# Patient Record
Sex: Female | Born: 1980 | Race: White | Hispanic: No | Marital: Married | Smoking: Former smoker
Health system: Southern US, Community
[De-identification: ages and names within clinical notes are randomized; demographics above are authoritative.]

## PROBLEM LIST (undated history)

## (undated) DIAGNOSIS — T7840XA Allergy, unspecified, initial encounter: Secondary | ICD-10-CM

## (undated) DIAGNOSIS — C801 Malignant (primary) neoplasm, unspecified: Secondary | ICD-10-CM

## (undated) DIAGNOSIS — L309 Dermatitis, unspecified: Secondary | ICD-10-CM

## (undated) DIAGNOSIS — E079 Disorder of thyroid, unspecified: Secondary | ICD-10-CM

## (undated) DIAGNOSIS — E669 Obesity, unspecified: Secondary | ICD-10-CM

## (undated) DIAGNOSIS — D649 Anemia, unspecified: Secondary | ICD-10-CM

## (undated) DIAGNOSIS — J45909 Unspecified asthma, uncomplicated: Secondary | ICD-10-CM

## (undated) DIAGNOSIS — E039 Hypothyroidism, unspecified: Secondary | ICD-10-CM

## (undated) HISTORY — PX: WISDOM TOOTH EXTRACTION: SHX21

## (undated) HISTORY — DX: Allergy, unspecified, initial encounter: T78.40XA

## (undated) HISTORY — PX: TUBAL LIGATION: SHX77

## (undated) HISTORY — PX: TONSILLECTOMY: SUR1361

## (undated) HISTORY — DX: Obesity, unspecified: E66.9

## (undated) HISTORY — DX: Unspecified asthma, uncomplicated: J45.909

---

## 2008-04-16 ENCOUNTER — Inpatient Hospital Stay (HOSPITAL_COMMUNITY): Admission: AD | Admit: 2008-04-16 | Discharge: 2008-04-20 | Payer: Self-pay | Admitting: Obstetrics and Gynecology

## 2010-11-09 ENCOUNTER — Inpatient Hospital Stay (HOSPITAL_COMMUNITY): Admission: AD | Admit: 2010-11-09 | Payer: Self-pay | Admitting: Obstetrics and Gynecology

## 2011-02-23 ENCOUNTER — Inpatient Hospital Stay (HOSPITAL_COMMUNITY)
Admission: AD | Admit: 2011-02-23 | Discharge: 2011-02-24 | Disposition: A | Discharge status: Home / Self Care | Payer: BC Managed Care – PPO | Source: Ambulatory Visit | Attending: Obstetrics & Gynecology | Admitting: Obstetrics & Gynecology

## 2011-02-23 DIAGNOSIS — O4700 False labor before 37 completed weeks of gestation, unspecified trimester: Secondary | ICD-10-CM | POA: Insufficient documentation

## 2011-02-23 LAB — URINALYSIS, ROUTINE W REFLEX MICROSCOPIC
Bilirubin Urine: NEGATIVE
Glucose, UA: NEGATIVE mg/dL
Hgb urine dipstick: NEGATIVE
Specific Gravity, Urine: <1.005 — ABNORMAL LOW (ref 1.005–1.030)
Urobilinogen, UA: 0.2 mg/dL (ref 0.0–1.0)

## 2011-03-25 NOTE — H&P (Signed)
Mallory Russell, Mallory Russell                  ACCOUNT NO.:  [id number]   MEDICAL RECORD NO.:  [id number]          PATIENT TYPE:  INP   LOCATION:  9167                          FACILITY:  [facility]   PHYSICIAN:  Lenoard Aden, M.D.DATE OF BIRTH:  09/19/1981   DATE OF ADMISSION:  04/16/2008  DATE OF DISCHARGE:                              HISTORY & PHYSICAL   CHIEF COMPLAINT:  LGA with polyhydramnios for induction.   HISTORY OF PRESENT ILLNESS:  She is a 30 year old white female, gravida  1, para 0, at 39+ weeks gestation with favorable cervix and estimated  fetal weight greater than 90th percentile for controlled effort  induction and delivery.  She is a nonsmoker and nondrinker.  She denies  domestic or physical violence.   MEDICATIONS:  Prenatal vitamins and Synthroid.   ALLERGIES:  No known drug allergies.  No known latex allergy.   FAMILY HISTORY:  Diabetes, heart disease, myocardial infarction, chronic  hypertension, breast cancer, thyroid disease, and rheumatoid arthritis.   PAST SURGICAL HISTORY:  Noncontributory.   PHYSICAL EXAMINATION:  GENERAL:  She is a well-developed, well-  nourished, white female in no acute distress.  HEENT:  Normal.  LUNGS:  Clear.  HEART:  Regular rhythm.  ABDOMEN:  Soft, gravid, and nontender.  Estimated fetal weight  approximately 9 pounds.  PELVIC:  Cervix is closed, 70%, vertex -1.  EXTREMITIES:  No cords.  NEUROLOGY:  Nonfocal.  SKIN:  Intact.   IMPRESSION:  39+ week intrauterine pregnancy with large for gestational  age, and new onset gestational hypertension.   PLAN:  Proceed with cervical ripening and induction with Cervidil  placement, NST reactive.      Lenoard Aden, M.D.  Electronically Signed     RJT/MEDQ  D:  04/16/2008  T:  04/16/2008  Job:  [id number]

## 2011-03-25 NOTE — Op Note (Signed)
[name], Mallory Russell                  ACCOUNT NO.:  [id number]   MEDICAL RECORD NO.:  [id number]          PATIENT TYPE:  INP   LOCATION:  9113                          FACILITY:  [facility]   PHYSICIAN:  Lenoard Aden, M.D.DATE OF BIRTH:  20-Apr-1981   DATE OF PROCEDURE:  DATE OF DISCHARGE:                               OPERATIVE REPORT   PREOPERATIVE DIAGNOSIS:  Active phase arrest at 39 weeks.   POSTOPERATIVE DIAGNOSIS:  Active phase arrest at 39 weeks.   PROCEDURE:  Primary low segment transverse cesarean section.   SURGEON:  Lenoard Aden, MD   ASSISTANT:  Marlinda Mike, C.N.M.   ANESTHESIA:  Epidural by Jean Rosenthal.   ESTIMATED BLOOD LOSS:  1000 mL.   COMPLICATIONS:  None.   DRAINS:  Foley.   COUNTS:  Correct.   The patient was taken to recovery in good condition   BRIEF OPERATIVE NOTE:  After being apprised of risks of anesthesia,  infection, bleeding, injury to abdominal organs, need for repair.  The  labor's immediate complications to include bowel and bladder injury.  The patient was brought to the operating room where she was administered  a dosing of epidural anesthetic without complications, prepped and  draped in usual sterile fashion.  Foley catheter placed after achieving  adequate anesthesia.  Dilute Marcaine solution placed.  Transverse skin  incision made with a scalpel, carried down to fascia, which nicked in  the midline and transversely using Mayo scissors.  Rectus muscle was  dissected sharply in the midline.  Peritoneum was entered sharply and  blunt, bladder blade placed.  Visceral peritoneum scored sharply off the  lower uterine segment.  [name] hysterotomy incision was made, atraumatic  delivery of 8 pound 13 ounces female, handed to pediatricians team,  Apgars 8 and 9, cord blood collected.  Placenta delivered manually,  intact 3-vessel cord.  Uterus, curetted using a dry-lap pack and closed  in 2 running imbricating layers of 0 Monocryl suture.  Good  hemostasis  was noted.  Bladder flap inspected and found be hemostatic.  Irrigation  accomplished.  Normal tubes and normal ovaries noted.  Fascia closed using a 0-Monocryl in continuous running fashion.  Subcutaneous tissue reapproximated using a 2-0 plain suture.  Skin  closed using staples.  The patient tolerated the procedure well and was  transferred to recovery in good condition.      Lenoard Aden, M.D.  Electronically Signed     RJT/MEDQ  D:  04/17/2008  T:  04/18/2008  Job:  [id number]

## 2011-03-27 ENCOUNTER — Encounter (HOSPITAL_COMMUNITY): Payer: BC Managed Care – PPO

## 2011-03-27 ENCOUNTER — Other Ambulatory Visit: Payer: Self-pay | Admitting: Obstetrics and Gynecology

## 2011-03-27 LAB — CBC
HCT: 37.5 % (ref 36.0–46.0)
Hemoglobin: 12.1 g/dL (ref 12.0–15.0)
MCH: 27.7 pg (ref 26.0–34.0)
MCV: 85.8 fL (ref 78.0–100.0)
Platelets: 173 10*3/uL (ref 150–400)
RBC: 4.37 MIL/uL (ref 3.87–5.11)
WBC: 14 10*3/uL — ABNORMAL HIGH (ref 4.0–10.5)

## 2011-03-27 LAB — RPR: RPR Ser Ql: NONREACTIVE

## 2011-03-28 NOTE — Discharge Summary (Signed)
NAME:  ADDY, MCMANNIS                ACCOUNT NO.:  [id number]   MEDICAL RECORD NO.:  [id number]           PATIENT TYPE:   LOCATION:                                FACILITY:  [facility]   PHYSICIAN:  Lenoard Aden, M.D.     DATE OF BIRTH:   DATE OF ADMISSION:  04/16/2008  DATE OF DISCHARGE:  04/20/2008                               DISCHARGE SUMMARY   REASON FOR ADMISSION:  Large for gestational age fetus and  polyhydramnios at term.   DISCHARGE DIAGNOSES:  Large for gestational age fetus and polyhydramnios  at term.   PERTINENT FINDINGS:  Normal physical exam.   LABORATORY DATA:  Normal CBC.   HOSPITAL COURSE:  The patient underwent uncomplicated primary C-section  for active phase arrest.   Postoperative course uncomplicated.  Discharged to home on postoperative  day #3.   FINAL DIAGNOSES:  Large for gestational age fetus and polyhydramnios at  term.   CONDITION ON DISCHARGE:  Good.   COMPLICATIONS:  None.   DISCHARGE MEDICATIONS:  Prenatal vitamins and Tylox.   FOLLOWUP:  Follow up in the office in 4-6 weeks.  Discharge teaching  done.      Lenoard Aden, M.D.  Electronically Signed     RJT/MEDQ  D:  06/15/2008  T:  06/15/2008  Job:  [id number]

## 2011-03-28 NOTE — Discharge Summary (Signed)
Mallory Russell, Mallory Russell                  ACCOUNT NO.:  [id number]   MEDICAL RECORD NO.:  [id number]          PATIENT TYPE:   LOCATION:                                 FACILITY:   PHYSICIAN:  Lenoard Aden, M.D.     DATE OF BIRTH:   DATE OF ADMISSION:  04/16/2008  DATE OF DISCHARGE:  04/20/2008                               DISCHARGE SUMMARY   The patient underwent uncomplicated C-section, April 16, 2008.   Discharge to home on day #3.  Discharge teaching done.  Tolerated diet  well.   Prenatal vitamins and iron given.      Lenoard Aden, M.D.  Electronically Signed     RJT/MEDQ  D:  05/20/2008  T:  05/21/2008  Job:  [id number]

## 2011-04-02 ENCOUNTER — Inpatient Hospital Stay (HOSPITAL_COMMUNITY)
Admission: RE | Admit: 2011-04-02 | Discharge: 2011-04-05 | DRG: 370 | Disposition: A | Discharge status: Home / Self Care | Payer: BC Managed Care – PPO | Source: Ambulatory Visit | Attending: Obstetrics and Gynecology | Admitting: Obstetrics and Gynecology

## 2011-04-02 DIAGNOSIS — O34219 Maternal care for unspecified type scar from previous cesarean delivery: Principal | ICD-10-CM | POA: Diagnosis present

## 2011-04-02 DIAGNOSIS — D62 Acute posthemorrhagic anemia: Secondary | ICD-10-CM | POA: Diagnosis not present

## 2011-04-02 DIAGNOSIS — E669 Obesity, unspecified: Secondary | ICD-10-CM | POA: Diagnosis present

## 2011-04-02 DIAGNOSIS — Z01818 Encounter for other preprocedural examination: Secondary | ICD-10-CM

## 2011-04-02 DIAGNOSIS — Z01812 Encounter for preprocedural laboratory examination: Secondary | ICD-10-CM

## 2011-04-02 DIAGNOSIS — O9903 Anemia complicating the puerperium: Secondary | ICD-10-CM | POA: Diagnosis not present

## 2011-04-02 DIAGNOSIS — E039 Hypothyroidism, unspecified: Secondary | ICD-10-CM | POA: Diagnosis present

## 2011-04-02 DIAGNOSIS — E079 Disorder of thyroid, unspecified: Secondary | ICD-10-CM | POA: Diagnosis present

## 2011-04-03 LAB — CBC
HCT: 29.2 % — ABNORMAL LOW (ref 36.0–46.0)
Hemoglobin: 9.3 g/dL — ABNORMAL LOW (ref 12.0–15.0)
MCV: 86.4 fL (ref 78.0–100.0)
RBC: 3.38 MIL/uL — ABNORMAL LOW (ref 3.87–5.11)
RDW: 15.6 % — ABNORMAL HIGH (ref 11.5–15.5)
WBC: 10.5 10*3/uL (ref 4.0–10.5)

## 2011-04-11 NOTE — Discharge Summary (Signed)
Mallory Russell, Mallory Russell                  ACCOUNT NO.:  [id number]  MEDICAL RECORD NO.:  [id number]           PATIENT TYPE:  I  LOCATION:  9106                          FACILITY:  [facility]  PHYSICIAN:  Lenoard Aden, M.D.DATE OF BIRTH:  1981/01/17  DATE OF ADMISSION:  04/02/2011 DATE OF DISCHARGE:  04/05/2011                              DISCHARGE SUMMARY   ADMISSION DIAGNOSES:  Intrauterine pregnancy at term, previous cesarean section.  DISCHARGE DIAGNOSES: 1. Post operative day 3. 2. Acute blood loss anemia stable and dependent edema stable.  HISTORY:  Gravida 4, para 1-0-2-1 at 25 weeks' gestation with [facility] of Apr 09, 2011.  Prenatal care was obtained at [facility] since 9 weeks and 5 days' gestation with Dr. Billy Coast as primary.  PRENATAL LABS:  A+, rubella immune, GBS negative, HIV negative, RPR nonreactive, hepatitis B nonreactive, and 1-hour GTT 130.  Prenatal course was complicated by hypothyroid and pubic diathesis. MEDICAL SURGICAL HISTORY:  The patient has a known history of PCOS, hypothyroid, obesity, previous cesarean section, LEEP, and tonsillectomy.  ALLERGIES:  The patient has no known drug allergies.  CURRENT MEDICATIONS:  Synthroid prenatal vitamins and Tylenol.  The patient presented for scheduled cesarean section.  Admission labs are WBCs 14.0, hemoglobin 12.1, hematocrit 37.5, and platelets 173, RPR negative and MRSA by PCR negative.  SURGERY:  The patient was delivered via repeat cesarean section on Apr 02, 2011, by Dr. Billy Coast for indication previous cesarean section.  The patient was delivered of a female infant weight of 9 pounds 2 ounces and Apgars 9 and 9.  Newborn was transferred to regular nursery. Please see  operative note for further details.  POSTOPERATIVE COURSE:  Postoperative course was complicated by acute blood loss anemia and dependent edema.  She was started on oral iron and received hydrochlorothiazide 25 mg on postop day #3 to be continued times 4-6  days.  LABORATORY DATA:  WBC 10.5, hemoglobin 9.3, hematocrit 29.2, and platelets 126.  Vital signs remained stable, and the patient was afebrile throughout her hospital course.  Physical exam was within normal limits with exception of +2 to +3 pedal edema.  Wound was well approximated with staples.  No erythema, no ecchymosis, and no drainage at site.  Newborn was breastfed and underwent circumcision during hospital stay.  The patient was discharged home on postoperative day #3 in stable condition.  Staples were removed prior to discharge and replaced with Steri-Strips.  Diet regular, activity ad lib, and postop weight lifting restrictions x2 weeks.  Instructions per [facility] booklet.  DISCHARGE MEDICATIONS: 1. Prenatal vitamins 1 tablet p.o. daily. 2. Colace 100 mg p.o. daily. 3. Ibuprofen 800 mg q.8 h. 4. Tylox 1-2 tablets p.o. q.4-6 h p.r.n. 5. Nu-Iron 150 one tablet p.o. daily. 6. Synthroid 175 mcg p.o. daily. 7. Hydrochlorothiazide 12.5 mg p.o. daily x4-6 days.  Follow up is in 6 weeks for postpartum visit with [facility].    ______________________________ Arlan Organ, CNM   ______________________________ Lenoard Aden, M.D.    DP/MEDQ  D:  04/05/2011  T:  04/06/2011  Job:  [id number]  Electronically Signed by Arlan Organ  CNM on 04/07/2011 12:26:24 AM Electronically Signed by Olivia Mackie M.D. on 04/11/2011 02:02:11 PM

## 2011-04-11 NOTE — Op Note (Signed)
  NAMETELLY, Mallory Russell                  ACCOUNT NO.:  [id number]  MEDICAL RECORD NO.:  [id number]           PATIENT TYPE:  I  LOCATION:  9106                          FACILITY:  [facility]  PHYSICIAN:  Lenoard Aden, M.D.DATE OF BIRTH:  11/07/1981  DATE OF PROCEDURE:  04/02/2011 DATE OF DISCHARGE:                              OPERATIVE REPORT   PREOPERATIVE DIAGNOSES: 1. A 39-week intrauterine pregnancy. 2. Previous C-section, repeat.  POSTOPERATIVE DIAGNOSES: 1. A 39-week intrauterine pregnancy. 2. Previous C-section, repeat.  PROCEDURE:  Repeat low segment transverse cesarean section.  SURGEON:  Lenoard Aden, MD.  ASSISTANT:  Marlinda Mike, CNM.  ANESTHESIA:  Spinal by Jean Rosenthal.  ESTIMATED BLOOD LOSS:  1000 mL.  COMPLICATIONS:  None.  DRAINS:  Foley.  COUNTS:  Correct.  The patient went to recovery in good condition.  FINDINGS:  Full-term living female, occiput transverse position, 9 pounds 2 ounces, Apgars 8 and 9, posterior placenta.  Normal uterus, normal tubes, normal ovaries, two-layer uterine closure.  BRIEF OPERATIVE NOTE:  After being apprised of the risks of anesthesia, infection, bleeding, injury to abdominal organs, need for repair, delayed versus immediate complications to include bowel and bladder injury, possible need for repair, the patient brought to the operating room where she administered spinal anesthetic without complications. She was prepped and draped in sterile fashion.  Foley catheter was placed.  After achieving adequate anesthesia, dilute Marcaine solution placed.  Pfannenstiel skin incision made with scalpel, carried down the fascia, which nicked in the midline, and transversely using Mayo scissors.  Rectus muscles dissected sharply in the midline, peritoneum entered sharply.  Bladder blade placed.  Visceral peritoneum scored sharply off the lower uterine segment.  Kerr hysterotomy incision made. Atraumatic delivery after amniotomy of  clear fluid, full-term living female, Apgars 8 and 9, handed to pediatricians attendance.  Cord blood collected.  Placenta delivered manually.  Intact three-vessel cord. Uterus exteriorized, curetted using dry lap pack, and closed in two running imbricating layers of zero Monocryl suture.  Single interrupted suture placed in midline for hemostasis.  Bladder flap was inspected and found to be hemostatic.  Irrigation accomplished.  Urine output is clear and urine output is placed.  At this time, the fascia was closed using zero Monocryl in continuous running fashion.  Subcutaneous tissue reapproximated using zero plain in continuous running fashion.  Skin closed using staples.  The patient tolerated the procedure well and transferred to recovery in good condition.     Lenoard Aden, M.D.     RJT/MEDQ  D:  04/02/2011  T:  04/03/2011  Job:  [id number]  Electronically Signed by Olivia Mackie M.D. on 04/11/2011 02:02:09 PM

## 2011-08-07 LAB — CBC
HCT: 32.3 — ABNORMAL LOW
HCT: 35.1 — ABNORMAL LOW
Hemoglobin: 11.9 — ABNORMAL LOW
MCHC: 33.9
MCHC: 35.3
MCV: 84.5
MCV: 85.8
Platelets: 147 — ABNORMAL LOW
RBC: 4.09
RDW: 15.3
WBC: 16.5 — ABNORMAL HIGH

## 2012-04-24 ENCOUNTER — Emergency Department (HOSPITAL_BASED_OUTPATIENT_CLINIC_OR_DEPARTMENT_OTHER): Payer: BC Managed Care – PPO

## 2012-04-24 ENCOUNTER — Emergency Department (HOSPITAL_BASED_OUTPATIENT_CLINIC_OR_DEPARTMENT_OTHER)
Admission: EM | Admit: 2012-04-24 | Discharge: 2012-04-24 | Disposition: A | Discharge status: Home / Self Care | Payer: BC Managed Care – PPO | Attending: Emergency Medicine | Admitting: Emergency Medicine

## 2012-04-24 ENCOUNTER — Encounter (HOSPITAL_BASED_OUTPATIENT_CLINIC_OR_DEPARTMENT_OTHER): Payer: Self-pay | Admitting: *Deleted

## 2012-04-24 DIAGNOSIS — E039 Hypothyroidism, unspecified: Secondary | ICD-10-CM | POA: Insufficient documentation

## 2012-04-24 DIAGNOSIS — S8000XA Contusion of unspecified knee, initial encounter: Secondary | ICD-10-CM | POA: Insufficient documentation

## 2012-04-24 DIAGNOSIS — Z3490 Encounter for supervision of normal pregnancy, unspecified, unspecified trimester: Secondary | ICD-10-CM

## 2012-04-24 DIAGNOSIS — Z3201 Encounter for pregnancy test, result positive: Secondary | ICD-10-CM | POA: Insufficient documentation

## 2012-04-24 DIAGNOSIS — M7989 Other specified soft tissue disorders: Secondary | ICD-10-CM | POA: Insufficient documentation

## 2012-04-24 DIAGNOSIS — X58XXXA Exposure to other specified factors, initial encounter: Secondary | ICD-10-CM | POA: Insufficient documentation

## 2012-04-24 DIAGNOSIS — N898 Other specified noninflammatory disorders of vagina: Secondary | ICD-10-CM | POA: Insufficient documentation

## 2012-04-24 DIAGNOSIS — S8011XA Contusion of right lower leg, initial encounter: Secondary | ICD-10-CM

## 2012-04-24 HISTORY — DX: Disorder of thyroid, unspecified: E07.9

## 2012-04-24 LAB — PREGNANCY, URINE: Preg Test, Ur: POSITIVE — AB

## 2012-04-24 NOTE — Discharge Instructions (Signed)
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 Your beta hCG was 29 and it was undeterminable whether you have a viable intrauterine pregnancy versus an ectopic pregnancy. Please return immediately to the nearest emergency department if you begin to experience abdominal cramping and pain. You may also wish to return to [facility] hospital if this occurs.    Contusion A contusion is a deep bruise. Contusions are the result of an injury that caused bleeding under the skin. The contusion may turn blue, purple, or yellow. Minor injuries will give you a painless contusion, but more severe contusions may stay painful and swollen for a few weeks.  CAUSES  A contusion is usually caused by a blow, trauma, or direct force to an area of the body. SYMPTOMS   Swelling and redness of the injured area.   Bruising of the injured area.   Tenderness and soreness of the injured area.   Pain.  DIAGNOSIS  The diagnosis can be made by taking a history and physical exam. An X-ray, CT scan, or MRI may be needed to determine if there were any associated injuries, such as fractures. TREATMENT  Specific treatment will depend on what area of the body was injured. In general, the best treatment for a contusion is resting, icing, elevating, and applying cold compresses to the injured area. Over-the-counter medicines may also be recommended for pain control. Ask your caregiver what the best treatment is for your contusion. HOME CARE INSTRUCTIONS   Put ice on the injured area.   Put ice in a plastic bag.   Place a towel between your skin and the bag.   Leave the ice on for 15 to 20 minutes, 3 to 4 times a day.   Only take over-the-counter or prescription medicines for pain, discomfort, or fever as directed by your caregiver. Your caregiver may recommend avoiding anti-inflammatory medicines (aspirin, ibuprofen, and naproxen) for 48 hours because these  medicines may increase bruising.   Rest the injured area.   If possible, elevate the injured area to reduce swelling.  SEEK IMMEDIATE MEDICAL CARE IF:   You have increased bruising or swelling.   You have pain that is getting worse.   Your swelling or pain is not relieved with medicines.  MAKE SURE YOU:   Understand these instructions.   Will watch your condition.   Will get help right away if you are not doing well or get worse.  Document Released: 08/06/2005 Document Revised: 10/16/2011 Document Reviewed: 09/01/2011 [facility] Patient Information 2012 [place], [place].Pregnancy If you are planning on getting pregnant, it is a good idea to make a preconception appointment with your care- giver to discuss having a healthy lifestyle before getting pregnant. Such as, diet, weight, exercise, taking prenatal vitamins especially folic acid (it helps prevent brain and spinal cord defects), avoiding alcohol, smoking and illegal drugs, medical problems (diabetes, convulsions), family history of genetic problems, working conditions and immunizations. It is better to have knowledge of these things and do something about them before getting pregnant. In your pregnancy, it is important to follow certain guidelines to have a healthy baby. It is very important to get good prenatal care and follow your caregiver's instructions. Prenatal care includes all the medical care you receive before your baby's birth. This helps to prevent problems during the pregnancy and childbirth. HOME CARE INSTRUCTIONS   Start your prenatal visits by the 12th week of pregnancy or before when possible. They are usually  scheduled monthly at first. They are more often in the last 2 months before delivery. It is important that you keep your caregiver's appointments and follow your caregiver's instructions regarding medication use, exercise, and diet.   During pregnancy, you are providing food for you and your baby. Eat a regular,  well-balanced diet. Choose foods such as meat, fish, milk and other dairy products, vegetables, fruits, whole-grain breads and cereals. Your caregiver will inform you of the ideal weight gain depending on your current height and weight. Drink lots of liquids. Try to drink 8 glasses of water a day.   Alcohol is associated with a number of birth defects including fetal alcohol syndrome. It is best to avoid alcohol completely. Smoking will cause low birth rate and prematurity. Use of alcohol and nicotine during your pregnancy also increases the chances that your child will be chemically dependent later in their life and may contribute to SIDS (Sudden Infant Death Syndrome).   Do not use illegal drugs.   Only take prescription or over-the-counter medications that are recommended by your caregiver. Other medications can cause genetic and physical problems in the baby.   Morning sickness can often be helped by keeping soda crackers at the bedside. Eat a couple before arising in the morning.   A sexual relationship may be continued until near the end of pregnancy if there are no other problems such as early (premature) leaking of amniotic fluid from the membranes, vaginal bleeding, painful intercourse or belly (abdominal) pain.   Exercise regularly. Check with your caregiver if you are unsure of the safety of some of your exercises.   Do not use hot tubs, steam rooms or saunas. These increase the risk of fainting or passing out and hurting yourself and the baby. Swimming is OK for exercise. Get plenty of rest, including afternoon naps when possible especially in the third trimester.   Avoid toxic odors and chemicals.   Do not wear high heels. They may cause you to lose your balance and fall.   Do not lift over 5 pounds. If you do lift anything, lift with your legs and thighs, not your back.   Avoid long trips, especially in the third trimester.   If you have to travel out of the city or state, take  a copy of your medical records with you.  SEEK IMMEDIATE MEDICAL CARE IF:   You develop an unexplained oral temperature above 102 F (38.9 C), or as your caregiver suggests.   You have leaking of fluid from the vagina. If leaking membranes are suspected, take your temperature and inform your caregiver of this when you call.   There is vaginal spotting or bleeding. Notify your caregiver of the amount and how many pads are used.   You continue to feel sick to your stomach (nauseous) and have no relief from remedies suggested, or you throw up (vomit) blood or coffee ground like materials.   You develop upper abdominal pain.   You have round ligament discomfort in the lower abdominal area. This still must be evaluated by your caregiver.   You feel contractions of the uterus.   You do not feel the baby move, or there is less movement than before.   You have painful urination.   You have abnormal vaginal discharge.   You have persistent diarrhea.   You get a severe headache.   You have problems with your vision.   You develop muscle weakness.   You feel dizzy and faint.  You develop shortness of breath.   You develop chest pain.   You have back pain that travels down to your leg and feet.   You feel irregular or a very fast heartbeat.   You develop excessive weight gain in a short period of time (5 pounds in 3 to 5 days).   You are involved with a domestic violence situation.  Document Released: 10/27/2005 Document Revised: 10/16/2011 Document Reviewed: 04/20/2009 [facility] Patient Information 2012 [place], [place].Pregnancy If you are planning on getting pregnant, it is a good idea to make a preconception appointment with your care- giver to discuss having a healthy lifestyle before getting pregnant. Such as, diet, weight, exercise, taking prenatal vitamins especially folic acid (it helps prevent brain and spinal cord defects), avoiding alcohol, smoking and illegal drugs,  medical problems (diabetes, convulsions), family history of genetic problems, working conditions and immunizations. It is better to have knowledge of these things and do something about them before getting pregnant. In your pregnancy, it is important to follow certain guidelines to have a healthy baby. It is very important to get good prenatal care and follow your caregiver's instructions. Prenatal care includes all the medical care you receive before your baby's birth. This helps to prevent problems during the pregnancy and childbirth. HOME CARE INSTRUCTIONS   Start your prenatal visits by the 12th week of pregnancy or before when possible. They are usually scheduled monthly at first. They are more often in the last 2 months before delivery. It is important that you keep your caregiver's appointments and follow your caregiver's instructions regarding medication use, exercise, and diet.   During pregnancy, you are providing food for you and your baby. Eat a regular, well-balanced diet. Choose foods such as meat, fish, milk and other dairy products, vegetables, fruits, whole-grain breads and cereals. Your caregiver will inform you of the ideal weight gain depending on your current height and weight. Drink lots of liquids. Try to drink 8 glasses of water a day.   Alcohol is associated with a number of birth defects including fetal alcohol syndrome. It is best to avoid alcohol completely. Smoking will cause low birth rate and prematurity. Use of alcohol and nicotine during your pregnancy also increases the chances that your child will be chemically dependent later in their life and may contribute to SIDS (Sudden Infant Death Syndrome).   Do not use illegal drugs.   Only take prescription or over-the-counter medications that are recommended by your caregiver. Other medications can cause genetic and physical problems in the baby.   Morning sickness can often be helped by keeping soda crackers at the  bedside. Eat a couple before arising in the morning.   A sexual relationship may be continued until near the end of pregnancy if there are no other problems such as early (premature) leaking of amniotic fluid from the membranes, vaginal bleeding, painful intercourse or belly (abdominal) pain.   Exercise regularly. Check with your caregiver if you are unsure of the safety of some of your exercises.   Do not use hot tubs, steam rooms or saunas. These increase the risk of fainting or passing out and hurting yourself and the baby. Swimming is OK for exercise. Get plenty of rest, including afternoon naps when possible especially in the third trimester.   Avoid toxic odors and chemicals.   Do not wear high heels. They may cause you to lose your balance and fall.   Do not lift over 5 pounds. If you do lift anything,  lift with your legs and thighs, not your back.   Avoid long trips, especially in the third trimester.   If you have to travel out of the city or state, take a copy of your medical records with you.  SEEK IMMEDIATE MEDICAL CARE IF:   You develop an unexplained oral temperature above 102 F (38.9 C), or as your caregiver suggests.   You have leaking of fluid from the vagina. If leaking membranes are suspected, take your temperature and inform your caregiver of this when you call.   There is vaginal spotting or bleeding. Notify your caregiver of the amount and how many pads are used.   You continue to feel sick to your stomach (nauseous) and have no relief from remedies suggested, or you throw up (vomit) blood or coffee ground like materials.   You develop upper abdominal pain.   You have round ligament discomfort in the lower abdominal area. This still must be evaluated by your caregiver.   You feel contractions of the uterus.   You do not feel the baby move, or there is less movement than before.   You have painful urination.   You have abnormal vaginal discharge.   You  have persistent diarrhea.   You get a severe headache.   You have problems with your vision.   You develop muscle weakness.   You feel dizzy and faint.   You develop shortness of breath.   You develop chest pain.   You have back pain that travels down to your leg and feet.   You feel irregular or a very fast heartbeat.   You develop excessive weight gain in a short period of time (5 pounds in 3 to 5 days).   You are involved with a domestic violence situation.  Document Released: 10/27/2005 Document Revised: 10/16/2011 Document Reviewed: 04/20/2009 [facility] Patient Information 2012 [place], [place].

## 2012-04-24 NOTE — ED Provider Notes (Signed)
History     CSN: [id number]  Arrival date & time 04/24/12  1445   First MD Initiated Contact with Patient 04/24/12 1617     4:53 PM HPI Patient reports 3 or 4 days ago began to notice bruising behind her right medial knee. States she follow up with her primary care physician at [facility] and was advised to come to the emergency department today for evaluation of a blood clot. Patient has a history of hypothyroidism but denies history of DVT or PE. Denies recent travel, hormone therapy, smoking, fast heart rate. Denies chest pain, shortness of breath. Patient denies abdominal pain, abdominal cramping, nausea, vomiting. Reports currently is on her menstrual cycle.  Patient is a 31 y.o. female presenting with leg pain. The history is provided by the patient.  Leg Pain  The incident occurred more than 2 days ago. There was no injury mechanism. The pain is present in the right knee. The quality of the pain is described as aching. The pain is mild. The pain has been constant since onset. Pertinent negatives include no numbness, no inability to bear weight, no loss of motion, no muscle weakness, no loss of sensation and no tingling. She reports no foreign bodies present. The symptoms are aggravated by palpation. She has tried nothing for the symptoms.    Past Medical History  Diagnosis Date  . Thyroid disease     Past Surgical History  Procedure Date  . Cesarean section   . Tonsillectomy     History reviewed. No pertinent family history.  History  Substance Use Topics  . Smoking status: Never Smoker   . Smokeless tobacco: Not on file  . Alcohol Use: Yes    OB History    Grav Para Term Preterm Abortions TAB SAB Ect Mult Living                  Review of Systems  Respiratory: Negative for shortness of breath.   Cardiovascular: Negative for chest pain.  Gastrointestinal: Negative for nausea, vomiting and abdominal pain.  Genitourinary: Positive for vaginal bleeding. Negative for  dysuria, urgency, frequency, vaginal discharge and vaginal pain.  Musculoskeletal:       Medial/posterior knee pain and bruising  Neurological: Negative for tingling and numbness.  All other systems reviewed and are negative.    Allergies  Latex  Home Medications   Current Outpatient Rx  Name Route Sig Dispense Refill  . LEVOTHYROXINE SODIUM 175 MCG PO TABS Oral Take 175 mcg by mouth daily.      BP 122/77  Pulse 72  Temp 97.5 F (36.4 C) (Oral)  Resp 20  Ht 5\' 2"  (1.575 m)  Wt 187 lb (84.823 kg)  BMI 34.20 kg/m2  SpO2 98%  LMP 04/24/2012  Physical Exam  Vitals reviewed. Constitutional: She is oriented to person, place, and time. Vital signs are normal. She appears well-developed and well-nourished.  HENT:  Head: Normocephalic and atraumatic.  Eyes: Conjunctivae are normal. Pupils are equal, round, and reactive to light.  Neck: Normal range of motion. Neck supple.  Cardiovascular: Normal rate, regular rhythm and normal heart sounds.  Exam reveals no friction rub.   No murmur heard. Pulmonary/Chest: Effort normal and breath sounds normal. She has no wheezes. She has no rhonchi. She has no rales. She exhibits no tenderness.  Genitourinary: Uterus normal. There is no tenderness or lesion on the right labia. There is no tenderness or lesion on the left labia. Uterus is not tender. Cervix exhibits no  motion tenderness and no discharge. Right adnexum displays no mass and no tenderness. Left adnexum displays no mass and no tenderness. There is bleeding around the vagina. No vaginal discharge found.       Cervix is closed. Does not appear to have products of conception.   Musculoskeletal: Normal range of motion.       Legs:      Right knee: Large contusion at the medial/posterior side of right knee. Mildly tender to palpation. No distal edema. Normal distal pulses and full range of motion.  Neurological: She is alert and oriented to person, place, and time. Coordination normal.    Skin: Skin is warm and dry. No rash noted. No erythema. No pallor.    ED Course  Procedures  Results for orders placed during the hospital encounter of 04/24/12  PREGNANCY, URINE      Component Value Range   Preg Test, Ur POSITIVE (*) NEGATIVE  HCG, QUANTITATIVE, PREGNANCY      Component Value Range   hCG, Beta Chain, Quant, S 29 (*) <5 mIU/mL   US Ob Comp Less 14 Wks  04/24/2012  *RADIOLOGY REPORT*  Clinical Data: Vaginal bleeding, pregnant, beta HCG 29  OBSTETRIC <14 WK [place] AND TRANSVAGINAL OB US  Technique:  Both transabdominal and transvaginal ultrasound examinations were performed for complete evaluation of the gestation as well as the maternal uterus, adnexal regions, and pelvic cul-de-sac.  Transvaginal technique was performed to assess early pregnancy.  Comparison:  None.  Intrauterine gestational sac:  Not visualized.  Maternal uterus/adnexae: Uterus measures 4.8 x 10.2 x 5.3 cm.  Endometrial complex measures 10 mm.  Right ovary measures 4.5 x 1.8 x 2.3 cm.  Left ovary measures 4.4 x 2.4 x 1.8 cm.  No adnexal masses.  No free fluid.  IMPRESSION: No intrauterine gestational sac is seen.  This is not unexpected given the low beta HCG of 29.  By definition, this reflects a pregnancy of unknown location. Differential considerations include early normal IUP, abnormal/failed IUP, or nonvisualized ectopic pregnancy.  Serial beta HCG is suggested, supplemented by follow-up ultrasound in 14 days (or earlier as clinically warranted).  Original Report Authenticated By: Charline Bills, M.D.   US Ob Transvaginal  04/24/2012  *RADIOLOGY REPORT*  Clinical Data: Vaginal bleeding, pregnant, beta HCG 29  OBSTETRIC <14 WK [place] AND TRANSVAGINAL OB US  Technique:  Both transabdominal and transvaginal ultrasound examinations were performed for complete evaluation of the gestation as well as the maternal uterus, adnexal regions, and pelvic cul-de-sac.  Transvaginal technique was performed to assess early  pregnancy.  Comparison:  None.  Intrauterine gestational sac:  Not visualized.  Maternal uterus/adnexae: Uterus measures 4.8 x 10.2 x 5.3 cm.  Endometrial complex measures 10 mm.  Right ovary measures 4.5 x 1.8 x 2.3 cm.  Left ovary measures 4.4 x 2.4 x 1.8 cm.  No adnexal masses.  No free fluid.  IMPRESSION: No intrauterine gestational sac is seen.  This is not unexpected given the low beta HCG of 29.  By definition, this reflects a pregnancy of unknown location. Differential considerations include early normal IUP, abnormal/failed IUP, or nonvisualized ectopic pregnancy.  Serial beta HCG is suggested, supplemented by follow-up ultrasound in 14 days (or earlier as clinically warranted).  Original Report Authenticated By: Charline Bills, M.D.   US Venous Img Lower Unilateral Right  04/24/2012  *RADIOLOGY REPORT*  Clinical Data: Right leg swelling and pain.  RIGHT LOWER EXTREMITY VENOUS DUPLEX ULTRASOUND  Technique:  Gray-scale sonography with graded  compression, as well as color Doppler and duplex ultrasound, were performed to evaluate the deep venous system of the lower extremity from the level of the common femoral vein through the popliteal and proximal calf veins. Spectral Doppler was utilized to evaluate flow at rest and with distal augmentation maneuvers.  Comparison:  None.  Findings: The deep venous system of the right lower leg is well visualized and appears normal.  There is good compressibility, plasticity, and augmentation.  IMPRESSION: No evidence of deep venous thrombosis of the right leg.  Original Report Authenticated By: Gwynn Burly, M.D.     MDM   Patient does not have a DVT on ultrasound of right lower extremity. Patient has a beta hCG of 29 and is uncertain whether this is an early pregnancy versus miscarriage. Patient reports she will followup with her OB/GYN on Monday. Recommended repeat beta-hCG for continued evaluation of bleeding and pregnancy. 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 Reports a history of ectopic pregnancy as well. Discussed importance of close followup. Patient voices understanding and is ready for discharge.      Thomasene Lot, PA-C 04/24/12 [phone]

## 2012-04-24 NOTE — ED Notes (Signed)
D/c home with no new rx given 

## 2012-04-24 NOTE — ED Notes (Signed)
Patient transported to Ultrasound 

## 2012-04-24 NOTE — ED Notes (Signed)
Pt states she has been several other places today that think she may have a blood clot. Told to come to ED. Bruising to posterior and medial aspect of right knee. No known injury.

## 2012-04-25 LAB — ABO/RH

## 2012-04-25 NOTE — ED Provider Notes (Signed)
Medical screening examination/treatment/procedure(s) were performed by non-physician practitioner and as supervising physician I was immediately available for consultation/collaboration.   No abdominal pain per PA. Pregnancy as incidental finding  Forbes Cellar, MD 04/25/12 [phone]

## 2012-09-15 LAB — OB RESULTS CONSOLE ANTIBODY SCREEN: Antibody Screen: NEGATIVE

## 2012-09-15 LAB — OB RESULTS CONSOLE ABO/RH: RH Type: POSITIVE

## 2012-09-15 LAB — OB RESULTS CONSOLE RUBELLA ANTIBODY, IGM: Rubella: IMMUNE

## 2012-09-15 LAB — OB RESULTS CONSOLE RPR: RPR: NONREACTIVE

## 2012-09-15 LAB — OB RESULTS CONSOLE HEPATITIS B SURFACE ANTIGEN: Hepatitis B Surface Ag: NEGATIVE

## 2013-01-18 ENCOUNTER — Other Ambulatory Visit: Payer: Self-pay | Admitting: Obstetrics and Gynecology

## 2013-02-28 ENCOUNTER — Encounter (HOSPITAL_COMMUNITY): Payer: Self-pay | Admitting: Pharmacy Technician

## 2013-03-04 ENCOUNTER — Encounter (HOSPITAL_COMMUNITY): Payer: Self-pay

## 2013-03-07 ENCOUNTER — Encounter (HOSPITAL_COMMUNITY): Payer: Self-pay

## 2013-03-07 ENCOUNTER — Encounter (HOSPITAL_COMMUNITY)
Admission: RE | Admit: 2013-03-07 | Discharge: 2013-03-07 | Disposition: A | Discharge status: Home / Self Care | Payer: BC Managed Care – PPO | Source: Ambulatory Visit | Attending: Obstetrics and Gynecology | Admitting: Obstetrics and Gynecology

## 2013-03-07 HISTORY — DX: Anemia, unspecified: D64.9

## 2013-03-07 HISTORY — DX: Hypothyroidism, unspecified: E03.9

## 2013-03-07 LAB — CBC
HCT: 32.7 % — ABNORMAL LOW (ref 36.0–46.0)
MCHC: 32.1 g/dL (ref 30.0–36.0)
Platelets: 176 10*3/uL (ref 150–400)
RDW: 17.7 % — ABNORMAL HIGH (ref 11.5–15.5)
WBC: 13.4 10*3/uL — ABNORMAL HIGH (ref 4.0–10.5)

## 2013-03-07 LAB — TYPE AND SCREEN: ABO/RH(D): A POS

## 2013-03-07 NOTE — Patient Instructions (Addendum)
   Your procedure is scheduled on:Wednesday April 30th  Enter through the [employer] [facility] at:10am Pick up the phone at the desk and dial [phone] and inform us of your arrival.  Please call this number if you have any problems the morning of surgery: [phone]  Remember: Do not eat food after midnight:on Tuesday  You may have water until 7:30am on Wednesday then nothing You may take your synthroid morning of surgery  Do not wear jewelry, make-up, or FINGER nail polish No metal in your hair or on your body. Do not wear lotions, powders, perfumes. You may wear deodorant.  Please use your CHG wash as directed prior to surgery.  Do not shave anywhere for at least 12 hours prior to first CHG shower.  Do not bring valuables to the hospital.   Leave suitcase in the car. After Surgery it may be brought to your room. For patients being admitted to the hospital, checkout time is 11:00am the day of discharge.

## 2013-03-08 NOTE — H&P (Signed)
NAMECHERINA, DHILLON                  ACCOUNT NO.:  [id number]  MEDICAL RECORD NO.:  [id number]  LOCATION:  [facility]                         FACILITY:  [facility]  PHYSICIAN:  Lenoard Aden, M.D.DATE OF BIRTH:  November 02, 1981  DATE OF ADMISSION:  01/12/2013 DATE OF DISCHARGE:                             HISTORY & PHYSICAL   CHIEF COMPLAINT:  Previous C-section for repeat and tubal ligation.  HISTORY OF PRESENT ILLNESS:  She is a 32 year old white female, G4, P2 at [redacted] weeks gestation for repeat C-section and tubal ligation.  ALLERGIES:  No known drug allergies.  MEDICATIONS:  Prenatal vitamins, Flexeril, Synthroid, and iron.  SOCIAL HISTORY:  Nonsmoker, nondrinker.  She denies domestic physical violence.  PAST SURGICAL HISTORY:  History of C-section x2, SAB x2, history of LEEP, history of tonsillectomy.  Prenatal course complicated by large for gestational age fetus with large abdominal circumference, LGA baby.  PHYSICAL EXAMINATION:  GENERAL:  She is a well-developed, well-nourished white female in no acute distress. HEENT:  Normal. NECK:  Supple.  Full range of motion. LUNGS:  Clear. HEART:  Regular rhythm. ABDOMEN:  Soft, gravid, nontender.  Estimated fetal weight 9 pounds. Cervix closed 50%, vertex -2. EXTREMITIES:  No nuchal cords. NEUROLOGIC:  Nonfocal. SKIN:  Intact.  IMPRESSION:  Intrauterine pregnancy, previous C-section x2, for repeat tubal ligation. Anemia Hypothyroidism  PLAN:  Proceed with repeat low segment transverse cesarean section. Risks of anesthesia, infection, bleeding, injury to abdominal organs, and need for repair discussed.  Delayed versus immediate complications to include bowel and bladder injury noted.  The patient acknowledges and wishes to proceed.     Lenoard Aden, M.D.     RJT/MEDQ  D:  03/08/2013  T:  03/08/2013  Job:  [id number]

## 2013-03-09 ENCOUNTER — Inpatient Hospital Stay (HOSPITAL_COMMUNITY): Payer: BC Managed Care – PPO | Admitting: Anesthesiology

## 2013-03-09 ENCOUNTER — Encounter (HOSPITAL_COMMUNITY): Payer: Self-pay | Admitting: *Deleted

## 2013-03-09 ENCOUNTER — Encounter (HOSPITAL_COMMUNITY): Payer: Self-pay | Admitting: Anesthesiology

## 2013-03-09 ENCOUNTER — Encounter (HOSPITAL_COMMUNITY)
Admission: AD | Disposition: A | Discharge status: Home / Self Care | Payer: Self-pay | Source: Ambulatory Visit | Attending: Obstetrics and Gynecology

## 2013-03-09 ENCOUNTER — Inpatient Hospital Stay (HOSPITAL_COMMUNITY)
Admission: AD | Admit: 2013-03-09 | Discharge: 2013-03-11 | DRG: 370 | Disposition: A | Discharge status: Home / Self Care | Payer: BC Managed Care – PPO | Source: Ambulatory Visit | Attending: Obstetrics and Gynecology | Admitting: Obstetrics and Gynecology

## 2013-03-09 DIAGNOSIS — O9903 Anemia complicating the puerperium: Secondary | ICD-10-CM | POA: Diagnosis not present

## 2013-03-09 DIAGNOSIS — O99284 Endocrine, nutritional and metabolic diseases complicating childbirth: Secondary | ICD-10-CM | POA: Diagnosis present

## 2013-03-09 DIAGNOSIS — D62 Acute posthemorrhagic anemia: Secondary | ICD-10-CM | POA: Diagnosis not present

## 2013-03-09 DIAGNOSIS — E079 Disorder of thyroid, unspecified: Secondary | ICD-10-CM | POA: Diagnosis present

## 2013-03-09 DIAGNOSIS — O3660X0 Maternal care for excessive fetal growth, unspecified trimester, not applicable or unspecified: Principal | ICD-10-CM | POA: Diagnosis present

## 2013-03-09 DIAGNOSIS — L259 Unspecified contact dermatitis, unspecified cause: Secondary | ICD-10-CM | POA: Diagnosis present

## 2013-03-09 DIAGNOSIS — E039 Hypothyroidism, unspecified: Secondary | ICD-10-CM | POA: Diagnosis present

## 2013-03-09 DIAGNOSIS — O34219 Maternal care for unspecified type scar from previous cesarean delivery: Secondary | ICD-10-CM | POA: Diagnosis present

## 2013-03-09 HISTORY — PX: BILATERAL SALPINGECTOMY: SHX5743

## 2013-03-09 SURGERY — Surgical Case
Anesthesia: Spinal | Wound class: Clean Contaminated

## 2013-03-09 MED ORDER — PHENYLEPHRINE HCL 10 MG/ML IJ SOLN
INTRAMUSCULAR | Status: DC | PRN
  Administered 2013-03-09 (×8): 80 ug via INTRAVENOUS

## 2013-03-09 MED ORDER — METOCLOPRAMIDE HCL 5 MG/ML IJ SOLN: 10.0000 mg | Freq: Three times a day (TID) | INTRAMUSCULAR | Status: DC | PRN

## 2013-03-09 MED ORDER — MORPHINE SULFATE (PF) 0.5 MG/ML IJ SOLN
INTRAMUSCULAR | Status: DC | PRN
  Administered 2013-03-09: .1 mg via INTRATHECAL

## 2013-03-09 MED ORDER — OXYCODONE-ACETAMINOPHEN 5-325 MG PO TABS
1.0000 | ORAL_TABLET | ORAL | Status: DC | PRN
  Administered 2013-03-10 – 2013-03-11 (×5): 1 via ORAL
  Filled 2013-03-09 (×5): qty 1

## 2013-03-09 MED ORDER — FENTANYL CITRATE 0.05 MG/ML IJ SOLN
INTRAMUSCULAR | Status: AC
  Filled 2013-03-09: qty 2

## 2013-03-09 MED ORDER — KETOROLAC TROMETHAMINE 30 MG/ML IJ SOLN
30.0000 mg | Freq: Four times a day (QID) | INTRAMUSCULAR | Status: DC | PRN
  Administered 2013-03-09: 30 mg via INTRAVENOUS
  Filled 2013-03-09: qty 1

## 2013-03-09 MED ORDER — ONDANSETRON HCL 4 MG/2ML IJ SOLN: 4.0000 mg | INTRAMUSCULAR | Status: DC | PRN

## 2013-03-09 MED ORDER — ONDANSETRON HCL 4 MG/2ML IJ SOLN
INTRAMUSCULAR | Status: DC | PRN
  Administered 2013-03-09: 4 mg via INTRAVENOUS

## 2013-03-09 MED ORDER — DIPHENHYDRAMINE HCL 25 MG PO CAPS
25.0000 mg | ORAL_CAPSULE | ORAL | Status: DC | PRN
  Administered 2013-03-10: 25 mg via ORAL
  Filled 2013-03-09: qty 1

## 2013-03-09 MED ORDER — KETOROLAC TROMETHAMINE 30 MG/ML IJ SOLN: 30.0000 mg | Freq: Four times a day (QID) | INTRAMUSCULAR | Status: DC | PRN

## 2013-03-09 MED ORDER — ONDANSETRON HCL 4 MG PO TABS: 4.0000 mg | ORAL_TABLET | ORAL | Status: DC | PRN

## 2013-03-09 MED ORDER — SIMETHICONE 80 MG PO CHEW
80.0000 mg | CHEWABLE_TABLET | Freq: Three times a day (TID) | ORAL | Status: DC
  Administered 2013-03-09 – 2013-03-10 (×4): 80 mg via ORAL

## 2013-03-09 MED ORDER — ONDANSETRON HCL 4 MG/2ML IJ SOLN: 4.0000 mg | Freq: Three times a day (TID) | INTRAMUSCULAR | Status: DC | PRN

## 2013-03-09 MED ORDER — LACTATED RINGERS IV SOLN
INTRAVENOUS | Status: DC
  Administered 2013-03-09: 21:00:00 via INTRAVENOUS

## 2013-03-09 MED ORDER — SODIUM CHLORIDE 0.9 % IJ SOLN: 3.0000 mL | INTRAMUSCULAR | Status: DC | PRN

## 2013-03-09 MED ORDER — CEFAZOLIN SODIUM-DEXTROSE 2-3 GM-% IV SOLR
2.0000 g | INTRAVENOUS | Status: AC
  Administered 2013-03-09: 2 g via INTRAVENOUS

## 2013-03-09 MED ORDER — SIMETHICONE 80 MG PO CHEW
80.0000 mg | CHEWABLE_TABLET | ORAL | Status: DC | PRN
  Administered 2013-03-10: 80 mg via ORAL

## 2013-03-09 MED ORDER — LANOLIN HYDROUS EX OINT: 1.0000 "application " | TOPICAL_OINTMENT | CUTANEOUS | Status: DC | PRN

## 2013-03-09 MED ORDER — TETANUS-DIPHTH-ACELL PERTUSSIS 5-2.5-18.5 LF-MCG/0.5 IM SUSP: 0.5000 mL | Freq: Once | INTRAMUSCULAR | Status: DC

## 2013-03-09 MED ORDER — BUPIVACAINE HCL (PF) 0.25 % IJ SOLN
INTRAMUSCULAR | Status: DC | PRN
  Administered 2013-03-09: 10 mL

## 2013-03-09 MED ORDER — FENTANYL CITRATE 0.05 MG/ML IJ SOLN: 25.0000 ug | INTRAMUSCULAR | Status: DC | PRN

## 2013-03-09 MED ORDER — 0.9 % SODIUM CHLORIDE (POUR BTL) OPTIME
TOPICAL | Status: DC | PRN
  Administered 2013-03-09: 1000 mL

## 2013-03-09 MED ORDER — IBUPROFEN 600 MG PO TABS
600.0000 mg | ORAL_TABLET | Freq: Four times a day (QID) | ORAL | Status: DC
  Administered 2013-03-10 – 2013-03-11 (×5): 600 mg via ORAL
  Filled 2013-03-09 (×5): qty 1

## 2013-03-09 MED ORDER — EPHEDRINE SULFATE 50 MG/ML IJ SOLN
INTRAMUSCULAR | Status: DC | PRN
  Administered 2013-03-09 (×2): 10 mg via INTRAVENOUS

## 2013-03-09 MED ORDER — DIPHENHYDRAMINE HCL 50 MG/ML IJ SOLN: 25.0000 mg | INTRAMUSCULAR | Status: DC | PRN

## 2013-03-09 MED ORDER — FENTANYL CITRATE 0.05 MG/ML IJ SOLN
INTRAMUSCULAR | Status: DC | PRN
  Administered 2013-03-09: 15 ug via INTRATHECAL

## 2013-03-09 MED ORDER — METHYLERGONOVINE MALEATE 0.2 MG PO TABS: 0.2000 mg | ORAL_TABLET | ORAL | Status: DC | PRN

## 2013-03-09 MED ORDER — SCOPOLAMINE 1 MG/3DAYS TD PT72: 1.0000 | MEDICATED_PATCH | Freq: Once | TRANSDERMAL | Status: DC

## 2013-03-09 MED ORDER — DIPHENHYDRAMINE HCL 50 MG/ML IJ SOLN: 12.5000 mg | INTRAMUSCULAR | Status: DC | PRN

## 2013-03-09 MED ORDER — NALBUPHINE HCL 10 MG/ML IJ SOLN
5.0000 mg | INTRAMUSCULAR | Status: DC | PRN
  Filled 2013-03-09: qty 1

## 2013-03-09 MED ORDER — MEPERIDINE HCL 25 MG/ML IJ SOLN: 6.2500 mg | INTRAMUSCULAR | Status: DC | PRN

## 2013-03-09 MED ORDER — SCOPOLAMINE 1 MG/3DAYS TD PT72
MEDICATED_PATCH | TRANSDERMAL | Status: AC
  Administered 2013-03-09: 1.5 mg via TRANSDERMAL
  Filled 2013-03-09: qty 1

## 2013-03-09 MED ORDER — METHYLERGONOVINE MALEATE 0.2 MG/ML IJ SOLN: 0.2000 mg | INTRAMUSCULAR | Status: DC | PRN

## 2013-03-09 MED ORDER — KETOROLAC TROMETHAMINE 60 MG/2ML IM SOLN: 60.0000 mg | Freq: Once | INTRAMUSCULAR | Status: DC | PRN

## 2013-03-09 MED ORDER — OXYTOCIN 10 UNIT/ML IJ SOLN
40.0000 [IU] | INTRAVENOUS | Status: DC | PRN
  Administered 2013-03-09: 40 [IU] via INTRAVENOUS

## 2013-03-09 MED ORDER — DIPHENHYDRAMINE HCL 25 MG PO CAPS
25.0000 mg | ORAL_CAPSULE | Freq: Four times a day (QID) | ORAL | Status: DC | PRN
  Administered 2013-03-10: 25 mg via ORAL
  Filled 2013-03-09: qty 1

## 2013-03-09 MED ORDER — BUPIVACAINE IN DEXTROSE 0.75-8.25 % IT SOLN
INTRATHECAL | Status: DC | PRN
  Administered 2013-03-09: 1.4 mg via INTRATHECAL

## 2013-03-09 MED ORDER — KETOROLAC TROMETHAMINE 30 MG/ML IJ SOLN
INTRAMUSCULAR | Status: AC
  Administered 2013-03-09: 30 mg via INTRAVENOUS
  Filled 2013-03-09: qty 1

## 2013-03-09 MED ORDER — PHENYLEPHRINE 40 MCG/ML (10ML) SYRINGE FOR IV PUSH (FOR BLOOD PRESSURE SUPPORT)
PREFILLED_SYRINGE | INTRAVENOUS | Status: AC
  Filled 2013-03-09: qty 15

## 2013-03-09 MED ORDER — DIBUCAINE 1 % RE OINT: 1.0000 "application " | TOPICAL_OINTMENT | RECTAL | Status: DC | PRN

## 2013-03-09 MED ORDER — ACETAMINOPHEN 10 MG/ML IV SOLN
1000.0000 mg | Freq: Four times a day (QID) | INTRAVENOUS | Status: DC | PRN
  Administered 2013-03-09: 1000 mg via INTRAVENOUS
  Filled 2013-03-09 (×2): qty 100

## 2013-03-09 MED ORDER — BUPIVACAINE HCL (PF) 0.25 % IJ SOLN
INTRAMUSCULAR | Status: AC
  Filled 2013-03-09: qty 30

## 2013-03-09 MED ORDER — CEFAZOLIN SODIUM-DEXTROSE 2-3 GM-% IV SOLR
INTRAVENOUS | Status: AC
  Filled 2013-03-09: qty 50

## 2013-03-09 MED ORDER — NALOXONE HCL 1 MG/ML IJ SOLN
1.0000 ug/kg/h | INTRAVENOUS | Status: DC | PRN
  Filled 2013-03-09: qty 2

## 2013-03-09 MED ORDER — ONDANSETRON HCL 4 MG/2ML IJ SOLN
INTRAMUSCULAR | Status: AC
  Filled 2013-03-09: qty 2

## 2013-03-09 MED ORDER — LACTATED RINGERS IV SOLN
INTRAVENOUS | Status: DC | PRN
  Administered 2013-03-09: 11:00:00 via INTRAVENOUS

## 2013-03-09 MED ORDER — SENNOSIDES-DOCUSATE SODIUM 8.6-50 MG PO TABS
2.0000 | ORAL_TABLET | Freq: Every day | ORAL | Status: DC
  Administered 2013-03-09 – 2013-03-10 (×2): 2 via ORAL

## 2013-03-09 MED ORDER — PRENATAL MULTIVITAMIN CH
1.0000 | ORAL_TABLET | Freq: Every day | ORAL | Status: DC
  Administered 2013-03-10: 1 via ORAL
  Filled 2013-03-09 (×2): qty 1

## 2013-03-09 MED ORDER — LACTATED RINGERS IV SOLN
Freq: Once | INTRAVENOUS | Status: AC
  Administered 2013-03-09: 10:00:00 via INTRAVENOUS

## 2013-03-09 MED ORDER — MORPHINE SULFATE 0.5 MG/ML IJ SOLN
INTRAMUSCULAR | Status: AC
  Filled 2013-03-09: qty 10

## 2013-03-09 MED ORDER — EPHEDRINE 5 MG/ML INJ
INTRAVENOUS | Status: AC
  Filled 2013-03-09: qty 10

## 2013-03-09 MED ORDER — MENTHOL 3 MG MT LOZG: 1.0000 | LOZENGE | OROMUCOSAL | Status: DC | PRN

## 2013-03-09 MED ORDER — OXYTOCIN 10 UNIT/ML IJ SOLN
INTRAMUSCULAR | Status: AC
  Filled 2013-03-09: qty 4

## 2013-03-09 MED ORDER — OXYTOCIN 40 UNITS IN LACTATED RINGERS INFUSION - SIMPLE MED: 62.5000 mL/h | INTRAVENOUS | Status: AC

## 2013-03-09 MED ORDER — WITCH HAZEL-GLYCERIN EX PADS: 1.0000 "application " | MEDICATED_PAD | CUTANEOUS | Status: DC | PRN

## 2013-03-09 MED ORDER — NALOXONE HCL 0.4 MG/ML IJ SOLN: 0.4000 mg | INTRAMUSCULAR | Status: DC | PRN

## 2013-03-09 MED ORDER — ZOLPIDEM TARTRATE 5 MG PO TABS: 5.0000 mg | ORAL_TABLET | Freq: Every evening | ORAL | Status: DC | PRN

## 2013-03-09 SURGICAL SUPPLY — 29 items
CLOTH BEACON ORANGE TIMEOUT ST (SAFETY) ×3 IMPLANT
CONTAINER PREFILL 10% NBF 15ML (MISCELLANEOUS) IMPLANT
DRAPE LG THREE QUARTER DISP (DRAPES) ×3 IMPLANT
DRSG OPSITE POSTOP 4X10 (GAUZE/BANDAGES/DRESSINGS) ×3 IMPLANT
DRSG OPSITE POSTOP 4X12 (GAUZE/BANDAGES/DRESSINGS) ×3 IMPLANT
DURAPREP 26ML APPLICATOR (WOUND CARE) ×3 IMPLANT
ELECT REM PT RETURN 9FT ADLT (ELECTROSURGICAL) ×3
ELECTRODE REM PT RTRN 9FT ADLT (ELECTROSURGICAL) ×2 IMPLANT
EXTRACTOR VACUUM M CUP 4 TUBE (SUCTIONS) IMPLANT
GLOVE BIO SURGEON STRL SZ7.5 (GLOVE) ×3 IMPLANT
GOWN PREVENTION PLUS XLARGE (GOWN DISPOSABLE) ×3 IMPLANT
GOWN STRL REIN XL XLG (GOWN DISPOSABLE) ×6 IMPLANT
KIT ABG SYR 3ML LUER SLIP (SYRINGE) IMPLANT
NEEDLE HYPO 25X1 1.5 SAFETY (NEEDLE) ×3 IMPLANT
NEEDLE HYPO 25X5/8 SAFETYGLIDE (NEEDLE) IMPLANT
NS IRRIG 1000ML POUR BTL (IV SOLUTION) ×3 IMPLANT
PACK C SECTION WH (CUSTOM PROCEDURE TRAY) ×3 IMPLANT
PAD OB MATERNITY 4.3X12.25 (PERSONAL CARE ITEMS) ×3 IMPLANT
STAPLER VISISTAT 35W (STAPLE) ×3 IMPLANT
SUT MNCRL 0 VIOLET CTX 36 (SUTURE) ×4 IMPLANT
SUT MON AB 2-0 CT1 27 (SUTURE) ×3 IMPLANT
SUT MON AB-0 CT1 36 (SUTURE) ×6 IMPLANT
SUT MONOCRYL 0 CTX 36 (SUTURE) ×2
SUT PLAIN 0 NONE (SUTURE) IMPLANT
SUT PLAIN 2 0 XLH (SUTURE) IMPLANT
SYR CONTROL 10ML LL (SYRINGE) ×3 IMPLANT
TOWEL OR 17X24 6PK STRL BLUE (TOWEL DISPOSABLE) ×9 IMPLANT
TRAY FOLEY CATH 14FR (SET/KITS/TRAYS/PACK) ×3 IMPLANT
WATER STERILE IRR 1000ML POUR (IV SOLUTION) ×3 IMPLANT

## 2013-03-09 NOTE — Transfer of Care (Signed)
Immediate Anesthesia Transfer of Care Note  Patient: Mallory Russell  Procedure(s) Performed: Procedure(s): CESAREAN SECTION (N/A) BILATERAL SALPINGECTOMY (Bilateral)  Patient Location: PACU  Anesthesia Type:Spinal  Level of Consciousness: awake, alert , oriented and patient cooperative  Airway & Oxygen Therapy: Patient Spontanous Breathing  Post-op Assessment: Report given to PACU RN and Post -op Vital signs reviewed and stable  Post vital signs: Reviewed and stable  Complications: No apparent anesthesia complications

## 2013-03-09 NOTE — Progress Notes (Signed)
Dr. Rodman Pickle aware of pt having couple pvs' on cardiac monitor.. Pt voices no complaints at this time. Will continue to monitor.

## 2013-03-09 NOTE — Op Note (Signed)
Cesarean Section Procedure Note  Indications: previous uterine incision kerr x2 Elective TL  Pre-operative Diagnosis: 39 week 0 day pregnancy.  Post-operative Diagnosis: same  Surgeon: Lenoard Aden   Assistants: Seymour Bars, MD  Anesthesia: Local anesthesia 0.25.% bupivacaine and Spinal anesthesia  ASA Class: 2  Procedure Details  The patient was seen in the Holding Room. The risks, benefits, complications, treatment options, and expected outcomes were discussed with the patient.  The patient concurred with the proposed plan, giving informed consent. The risks of anesthesia, infection, bleeding and possible injury to other organs discussed. Injury to bowel, bladder, or ureter with possible need for repair discussed. Possible need for transfusion with secondary risks of hepatitis or HIV acquisition discussed. Post operative complications to include but not limited to DVT, PE and Pneumonia noted. The site of surgery properly noted/marked. The patient was taken to Operating Room # 9, identified as AYSLIN KUNDERT and the procedure verified as C-Section Delivery. A Time Out was held and the above information confirmed.  After induction of anesthesia, the patient was draped and prepped in the usual sterile manner. A Pfannenstiel incision was made and carried down through the subcutaneous tissue to the fascia. Fascial incision was made and extended transversely using Mayo scissors. The fascia was separated from the underlying rectus tissue superiorly and inferiorly. The peritoneum was identified and entered. Peritoneal incision was extended longitudinally. The utero-vesical peritoneal reflection was incised transversely and the bladder flap was bluntly freed from the lower uterine segment. A low transverse uterine incision(Kerr hysterotomy) was made. Delivered from OA presentation was a  female with Apgar scores of 9 at one minute and 9 at five minutes. Bulb suctioning gently performed. Neonatal team in  attendance.After the umbilical cord was clamped and cut cord blood was obtained for evaluation. The placenta was removed intact and appeared normal. The uterus was curetted with a dry lap pack. Good hemostasis was noted.The uterine outline, tubes and ovaries appeared normal. The uterine incision was closed with running locked sutures of 0 Monocryl x 1 layer. Hemostasis was observed. Both tubes interrupted in a Modified Pomeroy fashion. Specimens to pathology.The parietal peritoneum was closed with a running 2-0 Monocryl suture. The fascia was then reapproximated with running sutures of 0 Monocryl. 2-0 plain to close [place] tissue.The skin was reapproximated with staples.  Instrument, sponge, and needle counts were correct prior the abdominal closure and at the conclusion of the case.   Findings: Per above  Estimated Blood Loss:  500         Drains: foley                 Specimens: placenta                 Complications:  None; patient tolerated the procedure well.         Disposition: PACU - hemodynamically stable.         Condition: stable  Attending Attestation: I performed the procedure.

## 2013-03-09 NOTE — Anesthesia Procedure Notes (Signed)
Spinal  Patient location during procedure: OR Start time: 03/09/2013 11:10 AM Staffing Performed by: anesthesiologist  Preanesthetic Checklist Completed: patient identified, site marked, surgical consent, pre-op evaluation, timeout performed, IV checked, risks and benefits discussed and monitors and equipment checked Spinal Block Patient position: sitting Prep: site prepped and draped and DuraPrep Patient monitoring: heart rate, cardiac monitor, continuous pulse ox and blood pressure Approach: midline Location: L3-4 Injection technique: single-shot Needle Needle type: Sprotte  Needle gauge: 24 G Needle length: 9 cm Assessment Sensory level: T4 Additional Notes Clear free flow CSF on first attempt.  No paresthesia.  Patient tolerated procedure well with no apparent complications.  Jasmine December, MD

## 2013-03-09 NOTE — Anesthesia Postprocedure Evaluation (Signed)
  Anesthesia Post-op Note  Anesthesia Post Note  Patient: Mallory Russell  Procedure(s) Performed: Procedure(s) (LRB): CESAREAN SECTION (N/A) BILATERAL SALPINGECTOMY (Bilateral)  Anesthesia type: Spinal  Patient location: PACU  Post pain: Pain level controlled  Post assessment: Post-op Vital signs reviewed  Last Vitals:  Filed Vitals:   03/09/13 1300  BP: 118/80  Pulse: 82  Temp: 36.7 C  Resp: 20    Post vital signs: Reviewed  Level of consciousness: awake  Complications: No apparent anesthesia complications

## 2013-03-09 NOTE — Anesthesia Preprocedure Evaluation (Signed)
Anesthesia Evaluation  Patient identified by MRN, date of birth, ID band Patient awake    Reviewed: Allergy & Precautions, H&P , NPO status , Patient's Chart, lab work & pertinent test results, reviewed documented beta blocker date and time   History of Anesthesia Complications Negative for: history of anesthetic complications  Airway Mallampati: III TM Distance: >3 FB Neck ROM: full    Dental  (+) Teeth Intact   Pulmonary neg pulmonary ROS, former smoker (quit 2007),  breath sounds clear to auscultation        Cardiovascular negative cardio ROS  Rhythm:regular Rate:Normal     Neuro/Psych negative neurological ROS  negative psych ROS   GI/Hepatic negative GI ROS, Neg liver ROS,   Endo/Other  Hypothyroidism Morbid obesity  Renal/GU negative Renal ROS  negative genitourinary   Musculoskeletal   Abdominal   Peds  Hematology  (+) anemia ,   Anesthesia Other Findings   Reproductive/Obstetrics (+) Pregnancy (h/o c/s x2, for repeat and BTL)                           Anesthesia Physical Anesthesia Plan  ASA: III  Anesthesia Plan: Spinal   Post-op Pain Management:    Induction:   Airway Management Planned:   Additional Equipment:   Intra-op Plan:   Post-operative Plan:   Informed Consent: I have reviewed the patients History and Physical, chart, labs and discussed the procedure including the risks, benefits and alternatives for the proposed anesthesia with the patient or authorized representative who has indicated his/her understanding and acceptance.     Plan Discussed with: Surgeon and CRNA  Anesthesia Plan Comments:         Anesthesia Quick Evaluation

## 2013-03-09 NOTE — Progress Notes (Signed)
Patient ID: Mallory Russell, female   DOB: 06-16-1981, 32 y.o.   MRN: [id number] Patient seen and examined. Consent witnessed and signed. No changes noted. Update completed.

## 2013-03-10 ENCOUNTER — Encounter (HOSPITAL_COMMUNITY): Payer: Self-pay | Admitting: Obstetrics and Gynecology

## 2013-03-10 LAB — CBC
MCH: 25.8 pg — ABNORMAL LOW (ref 26.0–34.0)
MCHC: 32.1 g/dL (ref 30.0–36.0)
MCV: 80.5 fL (ref 78.0–100.0)
Platelets: 128 10*3/uL — ABNORMAL LOW (ref 150–400)

## 2013-03-10 LAB — BIRTH TISSUE RECOVERY COLLECTION (PLACENTA DONATION)

## 2013-03-10 MED ORDER — LEVOTHYROXINE SODIUM 175 MCG PO TABS
175.0000 ug | ORAL_TABLET | Freq: Every day | ORAL | Status: DC
  Administered 2013-03-10 – 2013-03-11 (×2): 175 ug via ORAL
  Filled 2013-03-10 (×2): qty 1

## 2013-03-10 MED ORDER — HYDROCORTISONE 1 % EX CREA
TOPICAL_CREAM | Freq: Two times a day (BID) | CUTANEOUS | Status: DC
  Administered 2013-03-10 (×2): via TOPICAL
  Filled 2013-03-10: qty 28

## 2013-03-10 NOTE — Progress Notes (Signed)
POSTOPERATIVE DAY # 1 S/P CS   S:         Reports feeling sore - needs RX thyroid med this am             Tolerating po intake / no  nausea / no vomiting / + flatus / no BM             Bleeding is light             Pain controlled with motrin and percocet             Up ad lib / ambulatory/ voiding QS  Newborn breast feeding     O:  VS: BP 109/67  Pulse 69  Temp(Src) 98.8 F (37.1 C) (Oral)  Resp 20  Wt 105.688 kg (233 lb)  BMI 42.61 kg/m2  SpO2 97%  LMP 06/09/2012   LABS:  Recent Labs  03/07/13 1530 03/10/13 0610  WBC 13.4* 11.4*  HGB 10.5* 8.5*  PLT 176 128*                           I&O: Intake/Output     04/30 0701 - 05/01 0700 05/01 0701 - 05/02 0700   P.O. 1530    I.V. 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

## 2013-03-11 ENCOUNTER — Other Ambulatory Visit (HOSPITAL_COMMUNITY): Payer: BC Managed Care – PPO

## 2013-03-11 MED ORDER — POLYSACCHARIDE IRON COMPLEX 150 MG PO CAPS
150.0000 mg | ORAL_CAPSULE | Freq: Every day | ORAL | Status: DC
  Filled 2013-03-11: qty 1

## 2013-03-11 MED ORDER — OXYCODONE-ACETAMINOPHEN 5-325 MG PO TABS: 1.0000 | ORAL_TABLET | ORAL | Status: DC | PRN

## 2013-03-11 MED ORDER — DOCUSATE SODIUM 100 MG PO CAPS: 100.0000 mg | ORAL_CAPSULE | Freq: Every day | ORAL | Status: DC

## 2013-03-11 MED ORDER — DSS 100 MG PO CAPS: 100.0000 mg | ORAL_CAPSULE | Freq: Every day | ORAL | Status: DC

## 2013-03-11 MED ORDER — POLYSACCHARIDE IRON COMPLEX 150 MG PO CAPS: 150.0000 mg | ORAL_CAPSULE | Freq: Every day | ORAL | Status: DC

## 2013-03-11 MED ORDER — IBUPROFEN 600 MG PO TABS: 600.0000 mg | ORAL_TABLET | Freq: Four times a day (QID) | ORAL | Status: DC

## 2013-03-11 MED ORDER — HYDROCORTISONE 1 % EX CREA: TOPICAL_CREAM | Freq: Two times a day (BID) | CUTANEOUS | Status: DC

## 2013-03-11 NOTE — Progress Notes (Signed)
POSTOPERATIVE DAY # 2 S/P CS   S:         Reports feeling ready to go home             Tolerating po intake / no nausea / no vomiting / + flatus / no BM             Bleeding is spotting             Pain controlled with motrin and percocet             Up ad lib / ambulatory/ voiding QS  Newborn breast feeding    O:  VS: BP 106/71  Pulse 73  Temp(Src) 97.7 F (36.5 C) (Oral)  Resp 19  Wt 105.688 kg (233 lb)  BMI 42.61 kg/m2  SpO2 96%  LMP 06/09/2012   LABS:  Recent Labs  03/10/13 0610  WBC 11.4*  HGB 8.5*  PLT 128*               Physical Exam:             Alert and Oriented X3  Lungs: Clear and unlabored  Heart: regular rate and rhythm / no mumurs  Abdomen: soft, non-tender, non-distended active BS / rash improving on abdomen             Fundus: firm, non-tender, U-1             Dressing intact but 75% saturated dried blood              Incision:  approximated with staples   Perineum: no edema  Lochia: light  Extremities: 1+ pedaledema, no calf pain or tenderness, neg Homans  A:        POD # 2 S/P CS            IDA with compounded ABL loss postoperatively            Hypothyroidism - stable status            Contact dermatitis - resolving  P:        Routine postoperative care              DC home     Marlinda Mike CNM, MSN 03/11/2013, 8:57 AM

## 2013-03-11 NOTE — Discharge Summary (Signed)
POSTOPERATIVE DISCHARGE SUMMARY:  Patient ID: Mallory Russell MRN: [id number] DOB/AGE: Jun 05, 1981 32 y.o.  Admit date: 03/09/2013 Admission Diagnoses: 39 weeks / LGA /Hypothyroidism / Previous CS / desired sterilization   Discharge date:  03/11/2013 Discharge Diagnoses: POD 2 sp CS with BTL /  Hypothyroidism / IDA with ABL anemia  Prenatal history: [id number]   EDC : 03/16/2013, by Other Basis  Prenatal care at [facility]  Primary provider : [place] Prenatal course complicated by hypothroidism / prev CS / LGA  Prenatal Labs: ABO, Rh: A (11/06 0000) Positive Antibody: NEG (04/28 1520) Rubella: Immune (11/06 0000)  RPR: NON REACTIVE (04/28 1530)  HBsAg: Negative (11/06 0000)  HIV: Non-reactive (11/06 0000)  GBS:   NA 1 hr Glucola : abn - 3hr single value abn  Medical / Surgical History :  Past medical history:  Past Medical History  Diagnosis Date  . Thyroid disease   . Hypothyroidism   . Anemia     Past surgical history:  Past Surgical History  Procedure Laterality Date  . Cesarean section    . Tonsillectomy    . Cesarean section N/A 03/09/2013    Procedure: CESAREAN SECTION;  Surgeon: Lenoard Aden, MD;  Location: [facility];  Service: Obstetrics;  Laterality: N/A;  . Bilateral salpingectomy Bilateral 03/09/2013    Procedure: BILATERAL SALPINGECTOMY;  Surgeon: Lenoard Aden, MD;  Location: [facility];  Service: Obstetrics;  Laterality: Bilateral;    Family History: History reviewed. No pertinent family history.  Social History:  reports that she quit smoking about 7 years ago. She does not have any smokeless tobacco history on file. She reports that she does not drink alcohol or use illicit drugs.  Allergies: Adhesive   Current Medications at time of admission:  Prenatal & iron levothyroxine 175 mcg  Procedures: Cesarean section delivery of Female newborn by Dr Billy Coast with BTL See operative report for further details APGAR (1 MIN): 9   APGAR (5 MINS):  9    Baby weight: 8-6  Postoperative / postpartum course: mild contact dermatitis from OR prep/ dressing treated effectively with topical hydrocortisone  Physical Exam:   VSS: Temp:  [97.7 F (36.5 C)-98 F (36.7 C)] 97.7 F (36.5 C) (05/02 0556) Pulse Rate:  [73-88] 73 (05/02 0556) Resp:  [18-20] 19 (05/02 0556) BP: (106-127)/(71-82) 106/71 mmHg (05/02 0556) SpO2:  [96 %-97 %] 96 % (05/02 0556)  LABS:  Recent Labs  03/10/13 0610  WBC 11.4*  HGB 8.5*  PLT 128*    General: pleasant / NAD / ambulatory Heart:RR Lungs:clear Abdomen:soft / non-distended / active Extremities: 1+ pedal edema Dressing: intact honeycomb - to be replaced prior to dc due to 75% saturation Incision:  approximated with staples / no visible erythema / dried drainage  Discharge Instructions:  Discharged Condition: stable Activity: pelvic rest and postoperative restrictions x 2 weeks Diet: routine Medications: see below   Medication List    STOP taking these medications       IRON PO      TAKE these medications       DSS 100 MG Caps  Take 100 mg by mouth daily.     hydrocortisone cream 1 %  Apply topically 2 (two) times daily.     ibuprofen 600 MG tablet  Commonly known as:  ADVIL,MOTRIN  Take 1 tablet (600 mg total) by mouth every 6 (six) hours.     iron polysaccharides 150 MG capsule  Commonly known as:  NIFEREX  Take 1 capsule (150 mg total) by mouth daily.     levothyroxine 175 MCG tablet  Commonly known as:  SYNTHROID, LEVOTHROID  Take 175 mcg by mouth daily.     oxyCODONE-acetaminophen 5-325 MG per tablet  Commonly known as:  PERCOCET/ROXICET  Take 1-2 tablets by mouth every 4 (four) hours as needed.     prenatal multivitamin Tabs  Take 1 tablet by mouth daily at 12 noon.       Wound Care: dressing and staple removal Monday at [facility] Postpartum Instructions: [facility] discharge booklet - instructions reviewed Discharge to: Home  Follow up : [facility] in 6 weeks for routine postpartum visit                      Signed: Marlinda Mike CNM, MSN 03/11/2013, 9:08 AM

## 2013-03-11 NOTE — Plan of Care (Signed)
Problem: Discharge Progression Outcomes Goal: Remove staples per MD order Outcome: Not Applicable Date Met:  03/11/13 To be removed in office on Monday

## 2013-03-16 ENCOUNTER — Inpatient Hospital Stay (HOSPITAL_COMMUNITY): Admission: AD | Admit: 2013-03-16 | Payer: Self-pay | Source: Ambulatory Visit | Admitting: Obstetrics and Gynecology

## 2013-04-10 DEATH — deceased

## 2014-02-20 ENCOUNTER — Other Ambulatory Visit: Payer: Self-pay | Admitting: Dermatology

## 2014-09-11 ENCOUNTER — Encounter (HOSPITAL_COMMUNITY): Payer: Self-pay | Admitting: Obstetrics and Gynecology

## 2016-05-04 ENCOUNTER — Ambulatory Visit
Admission: EM | Admit: 2016-05-04 | Discharge: 2016-05-04 | Disposition: A | Discharge status: Home / Self Care | Payer: BC Managed Care – PPO | Attending: Family Medicine | Admitting: Family Medicine

## 2016-05-04 ENCOUNTER — Encounter: Payer: Self-pay | Admitting: Emergency Medicine

## 2016-05-04 DIAGNOSIS — J028 Acute pharyngitis due to other specified organisms: Principal | ICD-10-CM

## 2016-05-04 DIAGNOSIS — B9789 Other viral agents as the cause of diseases classified elsewhere: Secondary | ICD-10-CM

## 2016-05-04 DIAGNOSIS — J029 Acute pharyngitis, unspecified: Secondary | ICD-10-CM | POA: Diagnosis not present

## 2016-05-04 NOTE — ED Notes (Signed)
Patient c/o sore throat and runny nose for 4-5 days.  Patient denies fevers.

## 2016-05-04 NOTE — Discharge Instructions (Signed)
Use Ibuprofen as needed for pain. You can also use chloraseptic spray, warm salt water gargles as needed as well. Get plenty of fluids and rest.  Follow up as needed. This will slowly improve.  Take care  Dr. Lacinda Axon    Pharyngitis Pharyngitis is redness, pain, and swelling (inflammation) of your pharynx.  CAUSES  Pharyngitis is usually caused by infection. Most of the time, these infections are from viruses (viral) and are part of a cold. However, sometimes pharyngitis is caused by bacteria (bacterial). Pharyngitis can also be caused by allergies. Viral pharyngitis may be spread from person to person by coughing, sneezing, and personal items or utensils (cups, forks, spoons, toothbrushes). Bacterial pharyngitis may be spread from person to person by more intimate contact, such as kissing.  SIGNS AND SYMPTOMS  Symptoms of pharyngitis include:   Sore throat.   Tiredness (fatigue).   Low-grade fever.   Headache.  Joint pain and muscle aches.  Skin rashes.  Swollen lymph nodes.  Plaque-like film on throat or tonsils (often seen with bacterial pharyngitis). DIAGNOSIS  Your health care provider will ask you questions about your illness and your symptoms. Your medical history, along with a physical exam, is often all that is needed to diagnose pharyngitis. Sometimes, a rapid strep test is done. Other lab tests may also be done, depending on the suspected cause.  TREATMENT  Viral pharyngitis will usually get better in 3-4 days without the use of medicine. Bacterial pharyngitis is treated with medicines that kill germs (antibiotics).  HOME CARE INSTRUCTIONS   Drink enough water and fluids to keep your urine clear or pale yellow.   Only take over-the-counter or prescription medicines as directed by your health care provider:   If you are prescribed antibiotics, make sure you finish them even if you start to feel better.   Do not take aspirin.   Get lots of rest.   Gargle  with 8 oz of salt water ( tsp of salt per 1 qt of water) as often as every 1-2 hours to soothe your throat.   Throat lozenges (if you are not at risk for choking) or sprays may be used to soothe your throat. SEEK MEDICAL CARE IF:   You have large, tender lumps in your neck.  You have a rash.  You cough up green, yellow-brown, or bloody spit. SEEK IMMEDIATE MEDICAL CARE IF:   Your neck becomes stiff.  You drool or are unable to swallow liquids.  You vomit or are unable to keep medicines or liquids down.  You have severe pain that does not go away with the use of recommended medicines.  You have trouble breathing (not caused by a stuffy nose). MAKE SURE YOU:   Understand these instructions.  Will watch your condition.  Will get help right away if you are not doing well or get worse.   This information is not intended to replace advice given to you by your health care provider. Make sure you discuss any questions you have with your health care provider.   Document Released: 10/27/2005 Document Revised: 08/17/2013 Document Reviewed: 07/04/2013 Elsevier Interactive Patient Education [employer].

## 2016-05-04 NOTE — ED Provider Notes (Signed)
CSN: [id number]     Arrival date & time 05/04/16  0941 History   First MD Initiated Contact with Patient 05/04/16 [phone]     Chief Complaint  Patient presents with  . Sore Throat   (Consider location/radiation/quality/duration/timing/severity/associated sxs/prior Treatment) HPI  35 year old female presents with complaints of sore throat.  Patient states that she's had sore throat for the last 4-5 days. She reports associated postnasal drip. No associated fevers or chills. She's taken some Zyrtec without improvement. No known exacerbating factors. She denies any other associated symptoms other than postnasal drip and mild cough. No other complaints today.  Past Medical History  Diagnosis Date  . Thyroid disease   . Hypothyroidism   . Anemia    Past Surgical History  Procedure Laterality Date  . Cesarean section    . Tonsillectomy    . Cesarean section N/A 03/09/2013    Procedure: CESAREAN SECTION;  Surgeon: Lovenia Kim, MD;  Location: [facility];  Service: Obstetrics;  Laterality: N/A;  . Bilateral salpingectomy Bilateral 03/09/2013    Procedure: BILATERAL SALPINGECTOMY;  Surgeon: Lovenia Kim, MD;  Location: [facility];  Service: Obstetrics;  Laterality: Bilateral;  . Tubal ligation     History reviewed. No pertinent family history.   Social History  Substance Use Topics  . Smoking status: Former Smoker    Quit date: 03/07/2006  . Smokeless tobacco: None  . Alcohol Use: No   OB History    Gravida Para Term Preterm AB TAB SAB Ectopic Multiple Living   6 3 2  3  3   2      Review of Systems  Constitutional: Negative.   HENT: Positive for postnasal drip and sore throat.   All other systems reviewed and are negative.  Allergies  Adhesive  Home Medications   Prior to Admission medications   Medication Sig Start Date End Date Taking? Authorizing Provider  norethindrone-ethinyl estradiol (MICROGESTIN,JUNEL,LOESTRIN) 1-20 MG-MCG tablet Take 1 tablet by mouth daily.   Yes  Historical Provider, MD  docusate sodium 100 MG CAPS Take 100 mg by mouth daily. 03/11/13   Artelia Laroche, CNM  hydrocortisone cream 1 % Apply topically 2 (two) times daily. 03/11/13   Artelia Laroche, CNM  ibuprofen (ADVIL,MOTRIN) 600 MG tablet Take 1 tablet (600 mg total) by mouth every 6 (six) hours. 03/11/13   Artelia Laroche, CNM  iron polysaccharides (NIFEREX) 150 MG capsule Take 1 capsule (150 mg total) by mouth daily. 03/11/13   Artelia Laroche, CNM  levothyroxine (SYNTHROID, LEVOTHROID) 175 MCG tablet Take 175 mcg by mouth daily.    Historical Provider, MD  oxyCODONE-acetaminophen (PERCOCET/ROXICET) 5-325 MG per tablet Take 1-2 tablets by mouth every 4 (four) hours as needed. 03/11/13   Artelia Laroche, CNM  Prenatal Vit-Fe Fumarate-FA (PRENATAL MULTIVITAMIN) TABS Take 1 tablet by mouth daily at 12 noon.    Historical Provider, MD   Meds Ordered and Administered this Visit  Medications - No data to display  BP 119/78 mmHg  Pulse 82  Temp(Src) 98.4 F (36.9 C) (Tympanic)  Resp 16  Ht 5\' 2"  (1.575 m)  Wt 210 lb (95.255 kg)  BMI 38.40 kg/m2  SpO2 100%  LMP 04/30/2016 No data found.  Physical Exam  Constitutional: She is oriented to person, place, and time. She appears well-developed. No distress.  HENT:  Head: Normocephalic and atraumatic.  Mouth/Throat: Oropharynx is clear and moist.  Normal TMs bilaterally.  Eyes: Conjunctivae are normal. No scleral icterus.  Neck: Neck supple.  Cardiovascular: Normal rate and regular rhythm.   Pulmonary/Chest: Effort normal. She has no wheezes. She has no rales.  Abdominal: Soft. She exhibits no distension. There is no tenderness. There is no rebound and no guarding.  Musculoskeletal: Normal range of motion.  Lymphadenopathy:    She has no cervical adenopathy.  Neurological: She is alert and oriented to person, place, and time.  Skin: Skin is warm and dry. No rash noted.  Psychiatric: She has a normal mood and affect.   ED Course  Procedures (including  critical care time)  Labs Review Labs Reviewed - No data to display  Imaging Review No results found.  MDM   1. Sore throat (viral)    35 year old female presents with sore throat. Exam unremarkable. This is likely viral in etiology. Advised supportive care and treatment with over-the-counter ibuprofen, Chloraseptic spray, etc. Follow up as needed.    Coral Spikes, DO 05/04/16 1027

## 2017-04-16 ENCOUNTER — Encounter: Payer: Self-pay | Admitting: *Deleted

## 2017-04-16 ENCOUNTER — Ambulatory Visit
Admission: EM | Admit: 2017-04-16 | Discharge: 2017-04-16 | Disposition: A | Discharge status: Home / Self Care | Payer: BC Managed Care – PPO | Attending: Emergency Medicine | Admitting: Emergency Medicine

## 2017-04-16 DIAGNOSIS — G5602 Carpal tunnel syndrome, left upper limb: Secondary | ICD-10-CM | POA: Diagnosis not present

## 2017-04-16 DIAGNOSIS — M5412 Radiculopathy, cervical region: Secondary | ICD-10-CM

## 2017-04-16 MED ORDER — OXYCODONE-ACETAMINOPHEN 5-325 MG PO TABS: 1.0000 | ORAL_TABLET | Freq: Three times a day (TID) | ORAL | 0 refills | Status: DC | PRN

## 2017-04-16 MED ORDER — CYCLOBENZAPRINE HCL 10 MG PO TABS: 10.0000 mg | ORAL_TABLET | Freq: Two times a day (BID) | ORAL | 0 refills | Status: DC | PRN

## 2017-04-16 MED ORDER — PREDNISONE 10 MG PO TABS: ORAL_TABLET | ORAL | 0 refills | Status: DC

## 2017-04-16 NOTE — ED Triage Notes (Signed)
Patient started having left hand pain 4 days ago that is now radiating up her left arm. Patient does have a history of carpal tunnel.

## 2017-04-16 NOTE — Discharge Instructions (Signed)
Take medication as prescribed. Rest. Avoid strenuous or repetitive activity. Stretch. Wear brace for support.   Follow up with your primary care physician this week as needed. Return to Urgent care for new or worsening concerns.

## 2017-04-16 NOTE — ED Provider Notes (Signed)
[facility] ____________________________________________  Time seen: Approximately 6:25 PM  I have reviewed the triage vital signs and the nursing notes.   HISTORY  Chief Complaint Arm Pain   HPI Mallory Russell is a 36 y.o. female  presents for evaluation of left wrist pain that radiates to hand and Left arm. Patient reports that she notices pain 4 days ago. Patient reports the last 2 days she feels like she has pain in the entire left arm. Denies any fall, trauma or known injury. Patient however reports in the last 2 weeks she's been doing a lot of activity. Denies any heavy lifting. Patient reports she is doing a lot of repetitive movements and motions as they have been moving houses and unpacking. Also reports she has 3 kids that she is also seen busy and working with. Patient reports she does have a history of some slight since similar sensation with carpal tunnel but states pain did not involve her whole arm. Denies any chest pain or pain radiation from chest or into neck. Patient states her complaints are only in her arm. Patient states pain is fully reproducible by direct palpation or with movement. Patient states that she sits completely still pain much improved and mostly resolves. Patient states she she does occasionally have some achy pain when at rest but majority of pain resolves with rest. Patient again states pain is fully reproducible by direct palpation and movement. Denies any loss of sensation, decreased range of motion, rash, insect bite, arm swelling. Reports has continued to eat and drink well. Reports is been unresolved with multiple doses of ibuprofen daily. Denies any other medications taken. Denies any other alleviating measures taken. Reports right hand dominant. Denies fevers. Reports otherwise feels well. Denies any paresthesias or focal weakness.  Denies chest pain, shortness of breath, abdominal pain, dysuria, extremity pain, extremity swelling or rash.  Denies recent sickness. Denies recent antibiotic use.   Patient's last menstrual period was 02/14/2017. Denies pregnancy. States menstrual is normally irregular. Reports bilateral tubal ligation. [place]: PCP   Past Medical History:  Diagnosis Date  . Anemia   . Hypothyroidism   . Thyroid disease   No history of PE or DVT.  Patient Active Problem List   Diagnosis Date Noted  . Postpartum care following cesarean delivery (4/30) 03/10/2013    Past Surgical History:  Procedure Laterality Date  . BILATERAL SALPINGECTOMY Bilateral 03/09/2013   Procedure: BILATERAL SALPINGECTOMY;  Surgeon: Lovenia Kim, MD;  Location: [facility];  Service: Obstetrics;  Laterality: Bilateral;  . CESAREAN SECTION    . CESAREAN SECTION N/A 03/09/2013   Procedure: CESAREAN SECTION;  Surgeon: Lovenia Kim, MD;  Location: [facility];  Service: Obstetrics;  Laterality: N/A;  . TONSILLECTOMY    . TUBAL LIGATION       No current facility-administered medications for this encounter.   Current Outpatient Prescriptions:  .  levothyroxine (SYNTHROID, LEVOTHROID) 175 MCG tablet, Take 175 mcg by mouth daily., Disp: , Rfl:  .  norethindrone-ethinyl estradiol (MICROGESTIN,JUNEL,LOESTRIN) 1-20 MG-MCG tablet, Take 1 tablet by mouth daily., Disp: , Rfl:  .  cyclobenzaprine (FLEXERIL) 10 MG tablet, Take 1 tablet (10 mg total) by mouth 2 (two) times daily as needed for muscle spasms. Do not drive while taking as can cause drowsiness, Disp: 15 tablet, Rfl: 0 .  docusate sodium 100 MG CAPS, Take 100 mg by mouth daily., Disp: 30 capsule, Rfl: 0 .  hydrocortisone cream 1 %, Apply topically 2 (two)  times daily., Disp: 30 g, Rfl:  .  ibuprofen (ADVIL,MOTRIN) 600 MG tablet, Take 1 tablet (600 mg total) by mouth every 6 (six) hours., Disp: 30 tablet, Rfl: 0 .  iron polysaccharides (NIFEREX) 150 MG capsule, Take 1 capsule (150 mg total) by mouth daily., Disp: 45 capsule, Rfl: 0 .  oxyCODONE-acetaminophen (ROXICET)  5-325 MG tablet, Take 1 tablet by mouth every 8 (eight) hours as needed for moderate pain or severe pain (Do not drive or operate heavy machinery while taking as can cause drowsiness.). Do not take with other pain medication., Disp: 6 tablet, Rfl: 0 .  predniSONE (DELTASONE) 10 MG tablet, Start 60 mg po day one, then 50 mg po day two, taper by 10 mg daily until complete., Disp: 21 tablet, Rfl: 0 .  Prenatal Vit-Fe Fumarate-FA (PRENATAL MULTIVITAMIN) TABS, Take 1 tablet by mouth daily at 12 noon., Disp: , Rfl:   Allergies Adhesive [tape]   family history Father: Lung cancer Mother: Melanoma Siblings: Healthy  Social History Social History  Substance Use Topics  . Smoking status: Former Smoker    Quit date: 03/07/2006  . Smokeless tobacco: Never Used  . Alcohol use No    Review of Systems Constitutional: No fever/chills Eyes: No visual changes. ENT: No sore throat. Cardiovascular: Denies chest pain. Respiratory: Denies shortness of breath. Gastrointestinal: No abdominal pain.  No nausea, no vomiting.  No diarrhea.  No constipation. Genitourinary: Negative for dysuria. Musculoskeletal: Negative for back pain. As above.  Skin: Negative for rash.  .  ____________________________________________   PHYSICAL EXAM:  VITAL SIGNS: ED Triage Vitals  Enc Vitals Group     BP 04/16/17 1754 133/89     Pulse Rate 04/16/17 1754 71     Resp 04/16/17 1754 16     Temp 04/16/17 1754 98.1 F (36.7 C)     Temp Source 04/16/17 1754 Oral     SpO2 04/16/17 1754 100 %     Weight 04/16/17 1754 210 lb (95.3 kg)     Height 04/16/17 1754 5\' 2"  (1.575 m)     Head Circumference --      Peak Flow --      Pain Score 04/16/17 1757 6     Pain Loc --      Pain Edu? --      Excl. in [place]? --     Constitutional: Alert and oriented. Well appearing and in no acute distress. ENT      Head: Normocephalic and atraumatic.      Mouth/Throat: Mucous membranes are moist.Oropharynx non-erythematous. Neck:  No stridor. Supple without meningismus.  Hematological/Lymphatic/Immunilogical: No cervical lymphadenopathy. Cardiovascular: Normal rate, regular rhythm. Grossly normal heart sounds.  Good peripheral circulation. Respiratory: Normal respiratory effort without tachypnea nor retractions. Breath sounds are clear and equal bilaterally. No wheezes, rales, rhonchi. Gastrointestinal: Soft and nontender. No CVA tenderness. Musculoskeletal:  Steady gait. No midline cervical, thoracic or lumbar tenderness to palpation. Bilateral distal radial pulses equal and easily palpated. No midline tenderness. No chest tenderness. Left trapezius mild to moderate tenderness to direct palpation, no swelling, no ecchymosis, full range of motion present, pain increases with right and left cervical rotation with palpable muscle spasm. No left upper extremity bony tenderness. Diffuse left upper arm tenderness to direct palpation with increased pain in the volar aspect of the left wrist with positive left Tinel's and Phalen's sign, bilateral hand grips strong, left slightly weaker than right. Left hand with normal distal sensation and capillary refill no motor or tendon  deficit. Sensation intact to bilateral upper and lower extremities. Left upper extremity no bony tenderness, no ecchymosis, no erythema, no rash.  Neurologic:  Normal speech and language. No gross focal neurologic deficits are appreciated. Speech is normal. No gait instability.  Skin:  Skin is warm, dry  Psychiatric: Mood and affect are normal. Speech and behavior are normal. Patient exhibits appropriate insight and judgment   ___________________________________________   LABS (all labs ordered are listed, but only abnormal results are displayed)  Labs Reviewed - No data to display ____________________________________________  RADIOLOGY  No results found. ____________________________________________   PROCEDURES Procedures  Velcro cock-up splint  applied left wrist by RN.  INITIAL IMPRESSION / ASSESSMENT AND PLAN / ED COURSE  Pertinent labs & imaging results that were available during my care of the patient were reviewed by me and considered in my medical decision making (see chart for details).  Very well-appearing patient. No acute distress. Presents with 4 days of left arm pain. Denies chest pain, shortness of breath, back pain, pain radiation to neck chest or back. No cardiovascular history. Discussed in detail with patient do not suspect cardiac involvement. Pain is fully reproducible with palpation as well as movement, and resolves with rest. Patient recently moving and repetitive motion. History of carpal tunnel. Suspect left wrist carpal tunnel as well as left arm cervical radiculopathy and muscle strain. Encouraged rest, ice and stretching. Left wrist Velcro splint will treat patient with oral prednisone taper, when necessary Flexeril. Discussed use of when necessary Percocet as needed for breakthrough pain and use of this medication including do not mix with Flexeril.Discussed indication, risks and benefits of medications with patient. Discussed strict follow-up and return parameters.   [place] controlled substance database reviewed for last one year, and no recent controlled substances documented.  Discussed follow up with Primary care physician this week. Discussed follow up and return parameters including no resolution or any worsening concerns. Patient verbalized understanding and agreed to plan.   ____________________________________________   FINAL CLINICAL IMPRESSION(S) / ED DIAGNOSES  Final diagnoses:  Acute left sided cervical radiculopathy  Left carpal tunnel syndrome     Discharge Medication List as of 04/16/2017  6:51 PM    START taking these medications   Details  cyclobenzaprine (FLEXERIL) 10 MG tablet Take 1 tablet (10 mg total) by mouth 2 (two) times daily as needed for muscle spasms. Do not drive  while taking as can cause drowsiness, Starting Thu 04/16/2017, Normal    predniSONE (DELTASONE) 10 MG tablet Start 60 mg po day one, then 50 mg po day two, taper by 10 mg daily until complete., Normal      Percocet quantity 6 tablets given.  Note: This dictation was prepared with Dragon dictation along with smaller phrase technology. Any transcriptional errors that result from this process are unintentional.         Marylene Land, NP 04/16/17 2034

## 2018-01-22 ENCOUNTER — Ambulatory Visit
Admission: RE | Admit: 2018-01-22 | Discharge: 2018-01-22 | Disposition: A | Discharge status: Home / Self Care | Payer: BC Managed Care – PPO | Source: Ambulatory Visit | Attending: Medical Oncology | Admitting: Medical Oncology

## 2018-01-22 ENCOUNTER — Other Ambulatory Visit: Payer: Self-pay | Admitting: Medical Oncology

## 2018-01-22 DIAGNOSIS — M79672 Pain in left foot: Secondary | ICD-10-CM | POA: Insufficient documentation

## 2018-08-12 ENCOUNTER — Other Ambulatory Visit: Payer: Self-pay | Admitting: Obstetrics and Gynecology

## 2018-08-16 ENCOUNTER — Other Ambulatory Visit: Payer: Self-pay

## 2018-08-16 ENCOUNTER — Encounter (HOSPITAL_COMMUNITY): Payer: Self-pay | Admitting: *Deleted

## 2018-08-22 NOTE — Anesthesia Preprocedure Evaluation (Addendum)
Anesthesia Evaluation  Patient identified by MRN, date of birth, ID band Patient awake    Reviewed: Allergy & Precautions, NPO status , Patient's Chart, lab work & pertinent test results  History of Anesthesia Complications Negative for: history of anesthetic complications  Airway Mallampati: II  TM Distance: >3 FB Neck ROM: Full    Dental no notable dental hx. (+) Teeth Intact, Dental Advisory Given   Pulmonary former smoker,    Pulmonary exam normal breath sounds clear to auscultation       Cardiovascular negative cardio ROS Normal cardiovascular exam Rhythm:Regular Rate:Normal     Neuro/Psych negative neurological ROS  negative psych ROS   GI/Hepatic negative GI ROS, Neg liver ROS,   Endo/Other  Hypothyroidism Morbid obesity  Renal/GU negative Renal ROS     Musculoskeletal negative musculoskeletal ROS (+)   Abdominal   Peds  Hematology negative hematology ROS (+)   Anesthesia Other Findings Day of surgery medications reviewed with the patient.  Reproductive/Obstetrics negative OB ROS                            Anesthesia Physical Anesthesia Plan  ASA: III  Anesthesia Plan: General   Post-op Pain Management:    Induction: Intravenous  PONV Risk Score and Plan: 3 and Dexamethasone, Ondansetron, Treatment may vary due to age or medical condition and Scopolamine patch - Pre-op  Airway Management Planned: LMA  Additional Equipment:   Intra-op Plan:   Post-operative Plan: Extubation in OR  Informed Consent: I have reviewed the patients History and Physical, chart, labs and discussed the procedure including the risks, benefits and alternatives for the proposed anesthesia with the patient or authorized representative who has indicated his/her understanding and acceptance.   Dental advisory given  Plan Discussed with: CRNA  Anesthesia Plan Comments:       Anesthesia  Quick Evaluation

## 2018-08-23 ENCOUNTER — Encounter (HOSPITAL_COMMUNITY)
Admission: RE | Disposition: A | Discharge status: Home / Self Care | Payer: Self-pay | Source: Ambulatory Visit | Attending: Obstetrics and Gynecology

## 2018-08-23 ENCOUNTER — Encounter (HOSPITAL_COMMUNITY): Payer: Self-pay | Admitting: Certified Registered Nurse Anesthetist

## 2018-08-23 ENCOUNTER — Ambulatory Visit (HOSPITAL_COMMUNITY)
Admission: RE | Admit: 2018-08-23 | Discharge: 2018-08-23 | Disposition: A | Discharge status: Home / Self Care | Payer: BC Managed Care – PPO | Source: Ambulatory Visit | Attending: Obstetrics and Gynecology | Admitting: Obstetrics and Gynecology

## 2018-08-23 ENCOUNTER — Other Ambulatory Visit: Payer: Self-pay

## 2018-08-23 ENCOUNTER — Ambulatory Visit (HOSPITAL_COMMUNITY): Payer: BC Managed Care – PPO | Admitting: Anesthesiology

## 2018-08-23 DIAGNOSIS — Z87891 Personal history of nicotine dependence: Secondary | ICD-10-CM | POA: Insufficient documentation

## 2018-08-23 DIAGNOSIS — N920 Excessive and frequent menstruation with regular cycle: Secondary | ICD-10-CM | POA: Insufficient documentation

## 2018-08-23 DIAGNOSIS — Z85828 Personal history of other malignant neoplasm of skin: Secondary | ICD-10-CM | POA: Insufficient documentation

## 2018-08-23 DIAGNOSIS — N840 Polyp of corpus uteri: Secondary | ICD-10-CM | POA: Insufficient documentation

## 2018-08-23 DIAGNOSIS — Z888 Allergy status to other drugs, medicaments and biological substances status: Secondary | ICD-10-CM | POA: Diagnosis not present

## 2018-08-23 DIAGNOSIS — E039 Hypothyroidism, unspecified: Secondary | ICD-10-CM | POA: Diagnosis not present

## 2018-08-23 DIAGNOSIS — N841 Polyp of cervix uteri: Secondary | ICD-10-CM | POA: Diagnosis not present

## 2018-08-23 DIAGNOSIS — Z6841 Body Mass Index (BMI) 40.0 and over, adult: Secondary | ICD-10-CM | POA: Diagnosis not present

## 2018-08-23 HISTORY — DX: Malignant (primary) neoplasm, unspecified: C80.1

## 2018-08-23 HISTORY — DX: Dermatitis, unspecified: L30.9

## 2018-08-23 HISTORY — PX: DILITATION & CURRETTAGE/HYSTROSCOPY WITH NOVASURE ABLATION: SHX5568

## 2018-08-23 LAB — CBC
HCT: 37.3 % (ref 36.0–46.0)
Hemoglobin: 11.9 g/dL — ABNORMAL LOW (ref 12.0–15.0)
MCH: 24.3 pg — ABNORMAL LOW (ref 26.0–34.0)
MCHC: 31.9 g/dL (ref 30.0–36.0)
MCV: 76.3 fL — AB (ref 80.0–100.0)
PLATELETS: 255 10*3/uL (ref 150–400)
RBC: 4.89 MIL/uL (ref 3.87–5.11)
RDW: 14.5 % (ref 11.5–15.5)
WBC: 9 10*3/uL (ref 4.0–10.5)
nRBC: 0 % (ref 0.0–0.2)

## 2018-08-23 SURGERY — DILATATION & CURETTAGE/HYSTEROSCOPY WITH NOVASURE ABLATION
Anesthesia: General | Site: Vagina

## 2018-08-23 MED ORDER — LIDOCAINE HCL (CARDIAC) PF 100 MG/5ML IV SOSY
PREFILLED_SYRINGE | INTRAVENOUS | Status: DC | PRN
  Administered 2018-08-23: 100 mg via INTRAVENOUS

## 2018-08-23 MED ORDER — SODIUM CHLORIDE 0.9 % IJ SOLN
INTRAMUSCULAR | Status: AC
  Filled 2018-08-23: qty 50

## 2018-08-23 MED ORDER — LIDOCAINE HCL (CARDIAC) PF 100 MG/5ML IV SOSY
PREFILLED_SYRINGE | INTRAVENOUS | Status: AC
  Filled 2018-08-23: qty 5

## 2018-08-23 MED ORDER — SODIUM CHLORIDE 0.9 % IR SOLN
Status: DC | PRN
  Administered 2018-08-23: 3000 mL

## 2018-08-23 MED ORDER — PROMETHAZINE HCL 25 MG/ML IJ SOLN: 6.2500 mg | INTRAMUSCULAR | Status: DC | PRN

## 2018-08-23 MED ORDER — PROPOFOL 10 MG/ML IV BOLUS
INTRAVENOUS | Status: AC
  Filled 2018-08-23: qty 20

## 2018-08-23 MED ORDER — FENTANYL CITRATE (PF) 100 MCG/2ML IJ SOLN
INTRAMUSCULAR | Status: DC | PRN
  Administered 2018-08-23 (×2): 50 ug via INTRAVENOUS

## 2018-08-23 MED ORDER — DEXAMETHASONE SODIUM PHOSPHATE 10 MG/ML IJ SOLN
INTRAMUSCULAR | Status: DC | PRN
  Administered 2018-08-23: 4 mg via INTRAVENOUS

## 2018-08-23 MED ORDER — KETOROLAC TROMETHAMINE 30 MG/ML IJ SOLN: 30.0000 mg | Freq: Once | INTRAMUSCULAR | Status: DC | PRN

## 2018-08-23 MED ORDER — MIDAZOLAM HCL 2 MG/2ML IJ SOLN
INTRAMUSCULAR | Status: DC | PRN
  Administered 2018-08-23: 2 mg via INTRAVENOUS

## 2018-08-23 MED ORDER — SCOPOLAMINE 1 MG/3DAYS TD PT72
MEDICATED_PATCH | TRANSDERMAL | Status: AC
  Administered 2018-08-23: 1.5 mg via TRANSDERMAL
  Filled 2018-08-23: qty 1

## 2018-08-23 MED ORDER — BUPIVACAINE HCL (PF) 0.25 % IJ SOLN
INTRAMUSCULAR | Status: AC
  Filled 2018-08-23: qty 30

## 2018-08-23 MED ORDER — TRAMADOL HCL 50 MG PO TABS: 50.0000 mg | ORAL_TABLET | Freq: Four times a day (QID) | ORAL | 0 refills | Status: DC | PRN

## 2018-08-23 MED ORDER — FENTANYL CITRATE (PF) 100 MCG/2ML IJ SOLN: 25.0000 ug | INTRAMUSCULAR | Status: DC | PRN

## 2018-08-23 MED ORDER — ONDANSETRON HCL 4 MG/2ML IJ SOLN
INTRAMUSCULAR | Status: DC | PRN
  Administered 2018-08-23: 4 mg via INTRAVENOUS

## 2018-08-23 MED ORDER — VASOPRESSIN 20 UNIT/ML IV SOLN
INTRAVENOUS | Status: AC
  Filled 2018-08-23: qty 1

## 2018-08-23 MED ORDER — SCOPOLAMINE 1 MG/3DAYS TD PT72
1.0000 | MEDICATED_PATCH | Freq: Once | TRANSDERMAL | Status: DC
  Administered 2018-08-23: 1.5 mg via TRANSDERMAL

## 2018-08-23 MED ORDER — DEXAMETHASONE SODIUM PHOSPHATE 4 MG/ML IJ SOLN
INTRAMUSCULAR | Status: AC
  Filled 2018-08-23: qty 1

## 2018-08-23 MED ORDER — KETOROLAC TROMETHAMINE 30 MG/ML IJ SOLN
INTRAMUSCULAR | Status: DC | PRN
  Administered 2018-08-23: 30 mg via INTRAVENOUS

## 2018-08-23 MED ORDER — MIDAZOLAM HCL 2 MG/2ML IJ SOLN
INTRAMUSCULAR | Status: AC
  Filled 2018-08-23: qty 2

## 2018-08-23 MED ORDER — CEFAZOLIN SODIUM-DEXTROSE 2-4 GM/100ML-% IV SOLN
2.0000 g | INTRAVENOUS | Status: AC
  Administered 2018-08-23: 2 g via INTRAVENOUS

## 2018-08-23 MED ORDER — PROPOFOL 10 MG/ML IV BOLUS
INTRAVENOUS | Status: DC | PRN
  Administered 2018-08-23: 50 mg via INTRAVENOUS
  Administered 2018-08-23: 150 mg via INTRAVENOUS

## 2018-08-23 MED ORDER — ONDANSETRON HCL 4 MG/2ML IJ SOLN
INTRAMUSCULAR | Status: AC
  Filled 2018-08-23: qty 2

## 2018-08-23 MED ORDER — BUPIVACAINE HCL (PF) 0.25 % IJ SOLN
INTRAMUSCULAR | Status: DC | PRN
  Administered 2018-08-23: 20 mL

## 2018-08-23 MED ORDER — CEFAZOLIN SODIUM-DEXTROSE 2-4 GM/100ML-% IV SOLN
INTRAVENOUS | Status: AC
  Filled 2018-08-23: qty 100

## 2018-08-23 MED ORDER — KETOROLAC TROMETHAMINE 30 MG/ML IJ SOLN
INTRAMUSCULAR | Status: AC
  Filled 2018-08-23: qty 1

## 2018-08-23 MED ORDER — LACTATED RINGERS IV SOLN
INTRAVENOUS | Status: DC
  Administered 2018-08-23: 11:00:00 via INTRAVENOUS

## 2018-08-23 MED ORDER — FENTANYL CITRATE (PF) 100 MCG/2ML IJ SOLN
INTRAMUSCULAR | Status: AC
  Filled 2018-08-23: qty 2

## 2018-08-23 SURGICAL SUPPLY — 14 items
ABLATOR SURESOUND NOVASURE (ABLATOR) ×3 IMPLANT
CANISTER SUCT 3000ML PPV (MISCELLANEOUS) ×3 IMPLANT
CATH ROBINSON RED A/P 16FR (CATHETERS) ×3 IMPLANT
GLOVE BIO SURGEON STRL SZ7.5 (GLOVE) ×3 IMPLANT
GLOVE BIOGEL PI IND STRL 7.0 (GLOVE) ×1 IMPLANT
GLOVE BIOGEL PI INDICATOR 7.0 (GLOVE) ×2
GOWN STRL REUS W/TWL LRG LVL3 (GOWN DISPOSABLE) ×6 IMPLANT
PACK VAGINAL MINOR WOMEN LF (CUSTOM PROCEDURE TRAY) ×3 IMPLANT
PAD OB MATERNITY 4.3X12.25 (PERSONAL CARE ITEMS) ×3 IMPLANT
PAD PREP 24X48 CUFFED NSTRL (MISCELLANEOUS) ×3 IMPLANT
SYR TB 1ML 25GX5/8 (SYRINGE) ×3 IMPLANT
TOWEL OR 17X24 6PK STRL BLUE (TOWEL DISPOSABLE) ×6 IMPLANT
TUBING AQUILEX INFLOW (TUBING) ×3 IMPLANT
TUBING AQUILEX OUTFLOW (TUBING) ×3 IMPLANT

## 2018-08-23 NOTE — Discharge Instructions (Signed)
DISCHARGE INSTRUCTIONS: D&C / HYSTEROSCOPY / ENDOMETRIAL ABLATION The following instructions have been prepared to help you care for yourself upon your return home.  May Remove Scop patch on or before Thursday, August 26, 2018  May take Ibuprofen after 7:45 pm tonight  May take stool softner while taking narcotic pain medication to prevent constipation.  Drink plenty of water.  Personal hygiene:  Use sanitary pads for vaginal drainage, not tampons.  Shower the day after your procedure.  NO tub baths, pools or Jacuzzis for 2-3 weeks.  Wipe front to back after using the bathroom.  Activity and limitations:  Do NOT drive or operate any equipment for 24 hours. The effects of anesthesia are still present and drowsiness may result.  Do NOT rest in bed all day.  Walking is encouraged.  Walk up and down stairs slowly.  You may resume your normal activity in one to two days or as indicated by your physician.  Sexual activity: NO intercourse for at least 2 weeks after the procedure, or as indicated by your physician.  Diet: Eat a light meal as desired this evening. You may resume your usual diet tomorrow.  Return to work: You may resume your work activities in one to two days or as indicated by your doctor.  What to expect after your surgery: Expect to have vaginal bleeding/discharge for 2-3 days and spotting for up to 10 days. It is not unusual to have soreness for up to 1-2 weeks. You may have a slight burning sensation when you urinate for the first day. Mild cramps may continue for a couple of days. You may have a regular period in 2-6 weeks.  Call your doctor for any of the following:  Excessive vaginal bleeding, saturating and changing one pad every hour.  Inability to urinate 6 hours after discharge from hospital.  Pain not relieved by pain medication.  Fever of 100.4 F or greater.  Unusual vaginal discharge or odor.  Post Anesthesia Home Care  Instructions  Activity: Get plenty of rest for the remainder of the day. A responsible individual must stay with you for 24 hours following the procedure.  For the next 24 hours, DO NOT: -Drive a car -Paediatric nurse -Drink alcoholic beverages -Take any medication unless instructed by your physician -Make any legal decisions or sign important papers.  Meals: Start with liquid foods such as gelatin or soup. Progress to regular foods as tolerated. Avoid greasy, spicy, heavy foods. If nausea and/or vomiting occur, drink only clear liquids until the nausea and/or vomiting subsides. Call your physician if vomiting continues.  Special Instructions/Symptoms: Your throat may feel dry or sore from the anesthesia or the breathing tube placed in your throat during surgery. If this causes discomfort, gargle with warm salt water. The discomfort should disappear within 24 hours.  If you had a scopolamine patch placed behind your ear for the management of post- operative nausea and/or vomiting:  1. The medication in the patch is effective for 72 hours, after which it should be removed.  Wrap patch in a tissue and discard in the trash. Wash hands thoroughly with soap and water. 2. You may remove the patch earlier than 72 hours if you experience unpleasant side effects which may include dry mouth, dizziness or visual disturbances. 3. Avoid touching the patch. Wash your hands with soap and water after contact with the patch.

## 2018-08-23 NOTE — Anesthesia Procedure Notes (Signed)
Procedure Name: LMA Insertion Date/Time: 08/23/2018 11:30 AM Performed by: Bufford Spikes, CRNA Pre-anesthesia Checklist: Patient identified, Emergency Drugs available, Suction available and Patient being monitored Patient Re-evaluated:Patient Re-evaluated prior to induction Oxygen Delivery Method: Circle system utilized Preoxygenation: Pre-oxygenation with 100% oxygen Induction Type: IV induction Ventilation: Mask ventilation without difficulty LMA: LMA inserted LMA Size: 4.0 Number of attempts: 1 Airway Equipment and Method: Bite block Placement Confirmation: positive ETCO2 Tube secured with: Tape Dental Injury: Teeth and Oropharynx as per pre-operative assessment

## 2018-08-23 NOTE — Anesthesia Postprocedure Evaluation (Signed)
Anesthesia Post Note  Patient: TALAYSHA FREEBERG  Procedure(s) Performed: DILATATION & CURETTAGE/HYSTEROSCOPY WITH NOVASURE ABLATION (N/A Vagina )     Patient location during evaluation: PACU Anesthesia Type: General Level of consciousness: awake and alert Pain management: pain level controlled Vital Signs Assessment: post-procedure vital signs reviewed and stable Respiratory status: spontaneous breathing, nonlabored ventilation and respiratory function stable Cardiovascular status: blood pressure returned to baseline and stable Postop Assessment: no apparent nausea or vomiting Anesthetic complications: no    Last Vitals:  Vitals:   08/23/18 1047 08/23/18 1155  BP: (!) 141/90 129/77  Pulse: 66 82  Resp: 18 17  Temp: 36.6 C 36.8 C  SpO2: 100% 100%    Last Pain:  Vitals:   08/23/18 1155  TempSrc:   PainSc: 0-No pain   Pain Goal: Patients Stated Pain Goal: 3 (08/23/18 1155)               Brennan Bailey

## 2018-08-23 NOTE — Progress Notes (Signed)
Patient ID: Mallory Russell, female   DOB: January 13, 1981, 37 y.o.   MRN: [id number] Patient seen and examined. Consent witnessed and signed. No changes noted. Update completed. BP (!) 141/90   Pulse 66   Temp 97.9 F (36.6 C) (Oral)   Resp 18   Ht 5\' 2"  (1.575 m)   Wt 107 kg   LMP  (LMP Unknown) Comment: reports has been having period since july 2019  SpO2 100%   BMI 43.16 kg/m   CBC    Component Value Date/Time   WBC 9.0 08/23/2018 1035   RBC 4.89 08/23/2018 1035   HGB 11.9 (L) 08/23/2018 1035   HCT 37.3 08/23/2018 1035   PLT 255 08/23/2018 1035   MCV 76.3 (L) 08/23/2018 1035   MCH 24.3 (L) 08/23/2018 1035   MCHC 31.9 08/23/2018 1035   RDW 14.5 08/23/2018 1035

## 2018-08-23 NOTE — Op Note (Signed)
NAMENOBLE, BODIE MEDICAL RECORD [id number] ACCOUNT [id number] DATE OF BIRTH:05/12/1981 FACILITY: [facility] LOCATION: [facility] PHYSICIAN:Khalilah Hoke J. Ronita Hipps, MD  OPERATIVE REPORT  DATE OF PROCEDURE:  08/23/2018  PREOPERATIVE DIAGNOSIS:  Refractory menorrhagia.  POSTOPERATIVE DIAGNOSES:  Refractory menorrhagia plus endometrial/endocervical polyp.  PROCEDURES:  Diagnostic hysteroscopy, dilatation and curettage, endometrial polypectomy, NovaSure endometrial ablation.  SURGEON:  Lovenia Kim, MD  ASSISTANT:  None.  ANESTHESIA:  General and local.  ESTIMATED BLOOD LOSS:  Less than 50 mL.  FLUID DEFICIT:  Less than 50 mL.  PATHOLOGY:  Endometrial curettings and polyp fragments.  DISPOSITION:  The patient to recovery in good condition.  BRIEF OPERATIVE NOTE:  being apprised of the risks of anesthesia, infection, bleeding, injury to surrounding organs, possible need for repair, delayed risks and complications including bowel and bladder, internal vessel injury, possible need for repair,  the patient brought to the operating room where she was administered general anesthetic without complications.  Prepped and draped in usual sterile fashion.  Catheterized her bladder was emptied.  Exam under anesthesia reveals a bulky anteflexed uterus  and no adnexal masses.  Cervix was grasped using single tooth tenaculum.  Dilute paracervical block placed, 20 mL Marcaine, total.  Standard paracervical block.  Cervix easily dilated up to #[facility] dilator.  Hysteroscope placed.  Visualization reveals  a sessile polyp extending from the back wall of the uterus into the endocervical cavity, which was removed using endometrial polyp forceps and sharp curettage also confirms in a 4 quadrant method the cavity to be empty.  EMC and polyp fragments are sent  together.  At this time, the cavity appears normal with bilateral normal tubal ostia.  The NovaSure device was then seated to a length of 6.5 and a  width of 4.8 and after negative CO2 test was initiated to 172 watts for 35 seconds without complications,  the device was removed and inspected and found to be intact.  Repeat visualization reveals a well ablated endometrial cavity with no evidence of uterine perforation.  All instruments were removed.  Minimal bleeding noted.  Fluid deficit was noted.    The patient tolerated the procedure well, was awakened and transferred to recovery in good condition.  AN/NUANCE  D:08/23/2018 T:08/23/2018 JOB:[id number]

## 2018-08-23 NOTE — H&P (Signed)
Mallory Russell is an 37 y.o. female. Refractory menorrhagia for surgical evaluation.  Pertinent Gynecological History: Menses: flow is moderate Bleeding: dysfunctional uterine bleeding Contraception: tubal ligation DES exposure: denies Blood transfusions: none Sexually transmitted diseases: no past history Previous GYN Procedures: DNC  Last mammogram: na Date: na Last pap: normal Date: 2019 OB History: G2, P2   Menstrual History: Menarche age: 66 No LMP recorded (lmp unknown).    Past Medical History:  Diagnosis Date  . Anemia   . Cancer ([place])    skin - heel of right foot  . Eczema   . Hypothyroidism   . Thyroid disease     Past Surgical History:  Procedure Laterality Date  . BILATERAL SALPINGECTOMY Bilateral 03/09/2013   Procedure: BILATERAL SALPINGECTOMY;  Surgeon: Lovenia Kim, MD;  Location: [facility];  Service: Obstetrics;  Laterality: Bilateral;  . CESAREAN SECTION  2012, 2009  . CESAREAN SECTION N/A 03/09/2013   Procedure: CESAREAN SECTION;  Surgeon: Lovenia Kim, MD;  Location: [facility];  Service: Obstetrics;  Laterality: N/A;  . TONSILLECTOMY    . TUBAL LIGATION    . WISDOM TOOTH EXTRACTION      History reviewed. No pertinent family history.  Social History:  reports that she quit smoking about 12 years ago. Her smoking use included cigarettes. She has a 3.00 pack-year smoking history. She has never used smokeless tobacco. She reports that she drinks about 1.0 standard drinks of alcohol per week. She reports that she does not use drugs.  Allergies:  Allergies  Allergen Reactions  . Adhesive [Tape] Rash    If left on for extended periods of time     No medications prior to admission.    Review of Systems  Constitutional: Negative.   All other systems reviewed and are negative.   Height 5\' 2"  (1.575 m), weight 107 kg, unknown if currently breastfeeding. Physical Exam  Nursing note and vitals reviewed. Constitutional: She is oriented to person,  place, and time. She appears well-developed and well-nourished.  HENT:  Head: Normocephalic and atraumatic.  Neck: Normal range of motion. Neck supple.  Cardiovascular: Normal rate and regular rhythm.  Respiratory: Effort normal and breath sounds normal.  GI: Soft. Bowel sounds are normal.  Genitourinary: Vagina normal and uterus normal.  Musculoskeletal: Normal range of motion.  Neurological: She is alert and oriented to person, place, and time.  Skin: Skin is warm.  Psychiatric: She has a normal mood and affect.    No results found for this or any previous visit (from the past 24 hour(s)).  No results found.  Assessment/Plan: Refractory menorrhagia Diag HS, D&C with Novasure EAB Surgical risks discussed. Consent done.  Suhaylah Wampole J 08/23/2018, 6:22 AM

## 2018-08-23 NOTE — Transfer of Care (Signed)
Immediate Anesthesia Transfer of Care Note  Patient: Mallory Russell  Procedure(s) Performed: DILATATION & CURETTAGE/HYSTEROSCOPY WITH NOVASURE ABLATION (N/A Vagina )  Patient Location: PACU  Anesthesia Type:General  Level of Consciousness: awake, alert  and oriented  Airway & Oxygen Therapy: Patient Spontanous Breathing and Patient connected to nasal cannula oxygen  Post-op Assessment: Report given to RN and Post -op Vital signs reviewed and stable  Post vital signs: Reviewed and stable  Last Vitals:  Vitals Value Taken Time  BP 129/77 08/23/2018 11:55 AM  Temp    Pulse 71 08/23/2018 12:00 PM  Resp 18 08/23/2018 12:00 PM  SpO2 99 % 08/23/2018 12:00 PM  Vitals shown include unvalidated device data.  Last Pain:  Vitals:   08/23/18 1047  TempSrc: Oral  PainSc: 5       Patients Stated Pain Goal: 3 (58/94/83 4758)  Complications: No apparent anesthesia complications

## 2018-08-23 NOTE — Op Note (Signed)
08/23/2018  11:51 AM  PATIENT:  Mallory Russell  37 y.o. female  PRE-OPERATIVE DIAGNOSIS:  Dysfunctional Uterine Bleeding  POST-OPERATIVE DIAGNOSIS:  Dysfunctional Uterine Bleeding Endometrial polyp  PROCEDURE:  Procedure(s): DILATATION & CURETTAGE DIAGNOSTIC HYSTEROSCOPY  NOVASURE ABLATION ENDOMETRIAL POLYPECTOMY  SURGEON:  Surgeon(s): Brien Few, MD  ASSISTANTS: none   ANESTHESIA:   paracervical block  ESTIMATED BLOOD LOSS: 10 mL   DRAINS: none   LOCAL MEDICATIONS USED:  MARCAINE     SPECIMEN:  Source of Specimen:  EMC AND POLYP FRAGMENTS  DISPOSITION OF SPECIMEN:  PATHOLOGY  COUNTS:  YES  DICTATION #: [id number]  PLAN OF CARE: DC HOME  PATIENT DISPOSITION:  PACU - hemodynamically stable.

## 2018-08-24 ENCOUNTER — Encounter (HOSPITAL_COMMUNITY): Payer: Self-pay | Admitting: Obstetrics and Gynecology

## 2018-11-15 ENCOUNTER — Ambulatory Visit
Admission: EM | Admit: 2018-11-15 | Discharge: 2018-11-15 | Disposition: A | Discharge status: Home / Self Care | Payer: BC Managed Care – PPO | Attending: Family Medicine | Admitting: Family Medicine

## 2018-11-15 ENCOUNTER — Other Ambulatory Visit: Payer: Self-pay

## 2018-11-15 ENCOUNTER — Encounter: Payer: Self-pay | Admitting: Emergency Medicine

## 2018-11-15 DIAGNOSIS — J988 Other specified respiratory disorders: Secondary | ICD-10-CM | POA: Insufficient documentation

## 2018-11-15 DIAGNOSIS — Z87891 Personal history of nicotine dependence: Secondary | ICD-10-CM | POA: Diagnosis not present

## 2018-11-15 MED ORDER — DOXYCYCLINE HYCLATE 100 MG PO CAPS: 100.0000 mg | ORAL_CAPSULE | Freq: Two times a day (BID) | ORAL | 0 refills | Status: DC

## 2018-11-15 MED ORDER — BENZONATATE 100 MG PO CAPS: 100.0000 mg | ORAL_CAPSULE | Freq: Three times a day (TID) | ORAL | 0 refills | Status: DC | PRN

## 2018-11-15 MED ORDER — PREDNISONE 50 MG PO TABS: ORAL_TABLET | ORAL | 0 refills | Status: DC

## 2018-11-15 MED ORDER — HYDROCOD POLST-CPM POLST ER 10-8 MG/5ML PO SUER: 5.0000 mL | Freq: Every evening | ORAL | 0 refills | Status: AC | PRN

## 2018-11-15 NOTE — ED Provider Notes (Signed)
[facility]    CSN: [id number] Arrival date & time: 11/15/18  1924  History   Chief Complaint Chief Complaint  Patient presents with  . Cough   HPI   38 year old female presents with respiratory symptoms.  Patient states she has been sick for 3 weeks.  Patient reports cough, congestion, wheezing, shortness of breath, sinus pain and pressure.  She states her symptoms have been worsening for the past few days.  No documented fever.  She has been with Mucinex without improvement.  No known exacerbating factors.  No other associated symptoms. No other complaints.  PMH, Surgical Hx, Family Hx, Social History reviewed and updated as below.  Past Medical History:  Diagnosis Date  . Anemia   . Cancer ([place])    skin - heel of right foot  . Eczema   . Hypothyroidism   . Thyroid disease     Patient Active Problem List   Diagnosis Date Noted  . Postpartum care following cesarean delivery (4/30) 03/10/2013    Past Surgical History:  Procedure Laterality Date  . BILATERAL SALPINGECTOMY Bilateral 03/09/2013   Procedure: BILATERAL SALPINGECTOMY;  Surgeon: Lovenia Kim, MD;  Location: [facility];  Service: Obstetrics;  Laterality: Bilateral;  . CESAREAN SECTION  2012, 2009  . CESAREAN SECTION N/A 03/09/2013   Procedure: CESAREAN SECTION;  Surgeon: Lovenia Kim, MD;  Location: [facility];  Service: Obstetrics;  Laterality: N/A;  . DILITATION & CURRETTAGE/HYSTROSCOPY WITH NOVASURE ABLATION N/A 08/23/2018   Procedure: DILATATION & CURETTAGE/HYSTEROSCOPY WITH NOVASURE ABLATION;  Surgeon: Brien Few, MD;  Location: [facility];  Service: Gynecology;  Laterality: N/A;  . TONSILLECTOMY    . TUBAL LIGATION    . WISDOM TOOTH EXTRACTION      OB History    Gravida  6   Para  3   Term  2   Preterm      AB  3   Living  2     SAB  3   TAB      Ectopic      Multiple      Live Births               Home Medications    Prior to Admission medications   Medication  Sig Start Date End Date Taking? Authorizing Provider  cetirizine (ZYRTEC) 10 MG tablet Take 10 mg by mouth daily as needed for allergies.   Yes [provider]  EPINEPHrine (EPIPEN 2-PAK) 0.3 mg/0.3 mL IJ SOAJ injection Inject 0.3 mg into the muscle once.   Yes [provider]  fluticasone (FLONASE) 50 MCG/ACT nasal spray Place 1 spray into both nostrils daily as needed for allergies or rhinitis.   Yes [provider]  ibuprofen (ADVIL,MOTRIN) 200 MG tablet Take 600 mg by mouth 3 (three) times daily as needed for moderate pain.   Yes [provider]  levothyroxine (SYNTHROID, LEVOTHROID) 175 MCG tablet Take 175 mcg by mouth daily.   Yes [provider]  PRESCRIPTION MEDICATION Inject 1 Dose as directed once a week. Allergy shots   Yes [provider]  benzonatate (TESSALON) 100 MG capsule Take 1 capsule (100 mg total) by mouth 3 (three) times daily as needed. 11/15/18   Coral Spikes, DO  chlorpheniramine-HYDROcodone (TUSSIONEX PENNKINETIC ER) 10-8 MG/5ML SUER Take 5 mLs by mouth at bedtime as needed. 11/15/18   Coral Spikes, DO  doxycycline (VIBRAMYCIN) 100 MG capsule Take 1 capsule (100 mg total) by mouth 2 (two) times  daily. 11/15/18   Coral Spikes, DO  predniSONE (DELTASONE) 50 MG tablet 1 tablet daily x 5 days 11/15/18   Coral Spikes, DO    Family History Family History  Problem Relation Age of Onset  . Healthy Mother   . Cancer Father        lung    Social History Social History   Tobacco Use  . Smoking status: Former Smoker    Packs/day: 0.50    Years: 6.00    Pack years: 3.00    Types: Cigarettes    Last attempt to quit: 03/07/2006    Years since quitting: 12.7  . Smokeless tobacco: Never Used  Substance Use Topics  . Alcohol use: Yes    Alcohol/week: 1.0 standard drinks    Types: 1 Glasses of wine per week  . Drug use: No     Allergies   Adhesive [tape]   Review of Systems Review of Systems Per HPI  Physical  Exam Triage Vital Signs ED Triage Vitals  Enc Vitals Group     BP 11/15/18 1951 122/77     Pulse Rate 11/15/18 1951 84     Resp 11/15/18 1951 20     Temp 11/15/18 1951 98.4 F (36.9 C)     Temp Source 11/15/18 1951 Oral     SpO2 11/15/18 1951 100 %     Weight 11/15/18 1948 240 lb (108.9 kg)     Height 11/15/18 1948 5\' 2"  (1.575 m)     Head Circumference --      Peak Flow --      Pain Score 11/15/18 1947 5     Pain Loc --      Pain Edu? --      Excl. in [place]? --    Updated Vital Signs BP 122/77 (BP Location: Left Arm)   Pulse 84   Temp 98.4 F (36.9 C) (Oral)   Resp 20   Ht 5\' 2"  (1.575 m)   Wt 108.9 kg   SpO2 100%   BMI 43.90 kg/m   Visual Acuity Right Eye Distance:   Left Eye Distance:   Bilateral Distance:    Right Eye Near:   Left Eye Near:    Bilateral Near:     Physical Exam Vitals signs and nursing note reviewed.  Constitutional:      General: She is not in acute distress. HENT:     Head: Normocephalic and atraumatic.     Right Ear: Tympanic membrane normal.     Left Ear: Tympanic membrane normal.     Nose: Congestion present.     Comments: Maxillary sinus tenderness to palpation.    Mouth/Throat:     Pharynx: Oropharynx is clear. No posterior oropharyngeal erythema.  Eyes:     General:        Right eye: No discharge.        Left eye: No discharge.     Conjunctiva/sclera: Conjunctivae normal.  Cardiovascular:     Rate and Rhythm: Normal rate and regular rhythm.  Pulmonary:     Effort: Pulmonary effort is normal.     Comments: Wheezing diffusely. Neurological:     Mental Status: She is alert.    UC Treatments / Results  Labs (all labs ordered are listed, but only abnormal results are displayed) Labs Reviewed - No data to display  EKG None  Radiology No results found.  Procedures Procedures (including critical care time)  Medications Ordered in UC Medications - No  data to display  Initial Impression / Assessment and Plan / UC  Course  I have reviewed the triage vital signs and the nursing notes.  Pertinent labs & imaging results that were available during my care of the patient were reviewed by me and considered in my medical decision making (see chart for details).    38 year old female presents with respiratory infection.  Both upper and lower tract symptoms.  Duration of illness and clinical exam, I am placing her on doxycycline and prednisone.  Tessalon Perles and Tessalon for cough.  Final Clinical Impressions(s) / UC Diagnoses   Final diagnoses:  Respiratory infection     Discharge Instructions     Medication as prescribed.  Take care  Dr. Lacinda Axon    ED Prescriptions    Medication Sig Dispense Auth. Provider   predniSONE (DELTASONE) 50 MG tablet 1 tablet daily x 5 days 5 tablet Keanu Frickey G, DO   doxycycline (VIBRAMYCIN) 100 MG capsule Take 1 capsule (100 mg total) by mouth 2 (two) times daily. 14 capsule [place], [place] G, DO   chlorpheniramine-HYDROcodone (TUSSIONEX PENNKINETIC ER) 10-8 MG/5ML SUER Take 5 mLs by mouth at bedtime as needed. 60 mL Ericson Nafziger G, DO   benzonatate (TESSALON) 100 MG capsule Take 1 capsule (100 mg total) by mouth 3 (three) times daily as needed. 30 capsule Coral Spikes, DO     Controlled Substance Prescriptions  Controlled Substance Registry consulted? Not Applicable   Coral Spikes, DO 11/15/18 2046

## 2018-11-15 NOTE — ED Triage Notes (Signed)
Pt c/o cough, congestion, chest pain, sinus congestion, shortness of breath, light headiness. Started about 3 weeks ago. Denies fever. She has been getting worse in the last couple of days.

## 2018-11-15 NOTE — Discharge Instructions (Signed)
Medication as prescribed.  Take care  Dr. Vickey Boak  

## 2019-10-17 ENCOUNTER — Other Ambulatory Visit: Payer: Self-pay | Admitting: Obstetrics and Gynecology

## 2019-10-17 DIAGNOSIS — N6320 Unspecified lump in the left breast, unspecified quadrant: Secondary | ICD-10-CM

## 2019-10-19 ENCOUNTER — Ambulatory Visit
Admission: RE | Admit: 2019-10-19 | Discharge: 2019-10-19 | Disposition: A | Discharge status: Home / Self Care | Payer: BC Managed Care – PPO | Source: Ambulatory Visit | Attending: Obstetrics and Gynecology | Admitting: Obstetrics and Gynecology

## 2019-10-19 ENCOUNTER — Other Ambulatory Visit: Payer: Self-pay

## 2019-10-19 DIAGNOSIS — N6320 Unspecified lump in the left breast, unspecified quadrant: Secondary | ICD-10-CM

## 2020-08-08 IMAGING — US US BREAST*L* LIMITED INC AXILLA
1 series · 10 of 10 positions shown · non-contrast
Comparison: Baseline screening mammogram dated 10/13/2019.

CLINICAL DATA: Three adjacent masses in the upper-outer left breast
on a recent baseline screening mammogram [HOSPITAL] OBGYN.

EXAM:
DIGITAL DIAGNOSTIC LEFT MAMMOGRAM WITH TOMO
ULTRASOUND LEFT BREAST

[Series 1: us breast*left* limited inc axilla · 0.06mm/px · 10 of 10 slices shown]
[im 1/10]
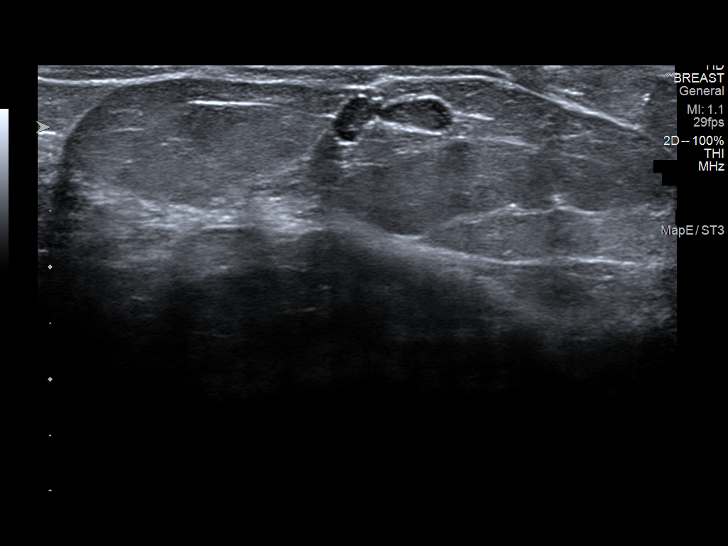
[im 2/10]
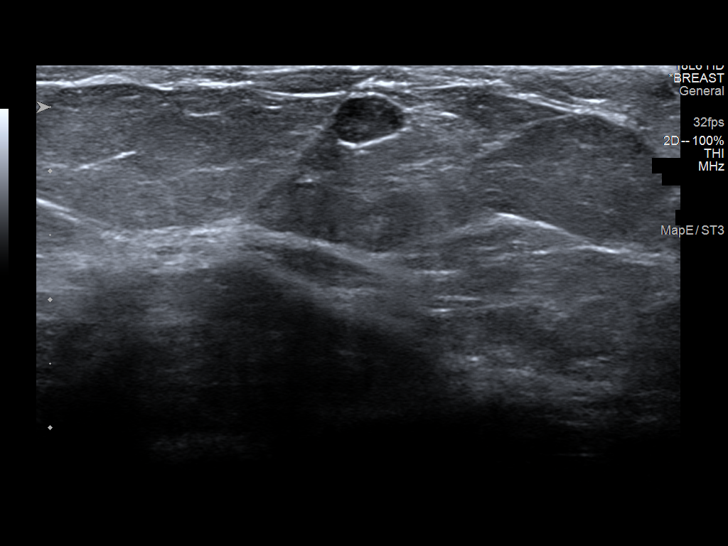
[im 3/10]
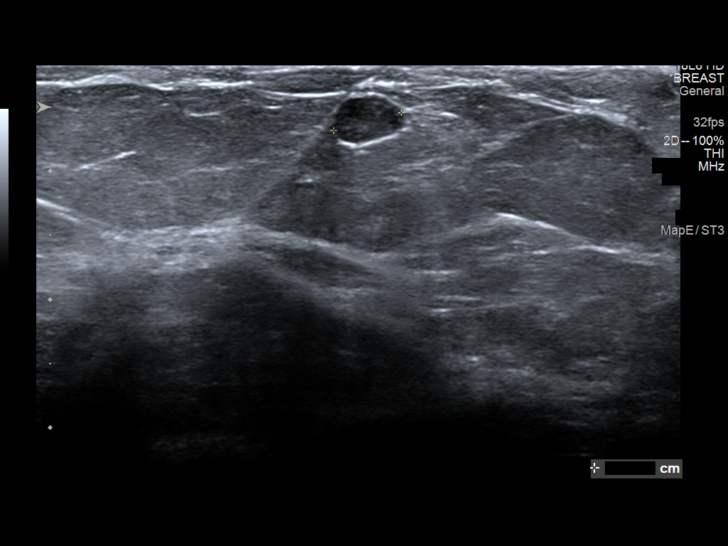
[im 4/10]
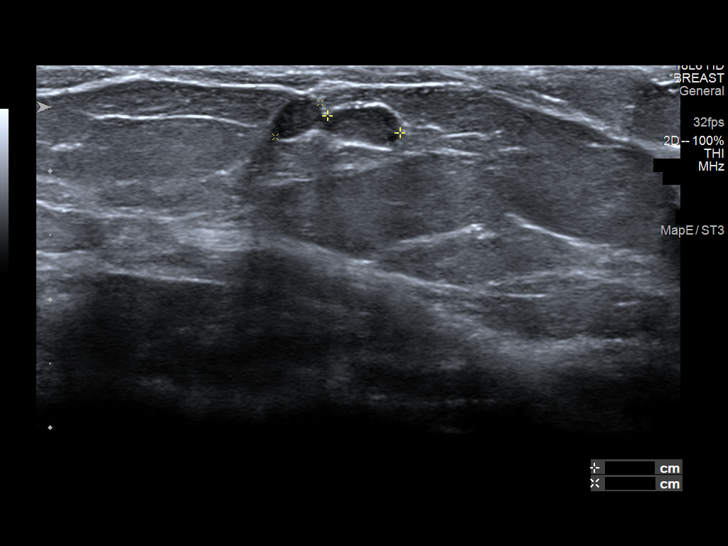
[im 5/10]
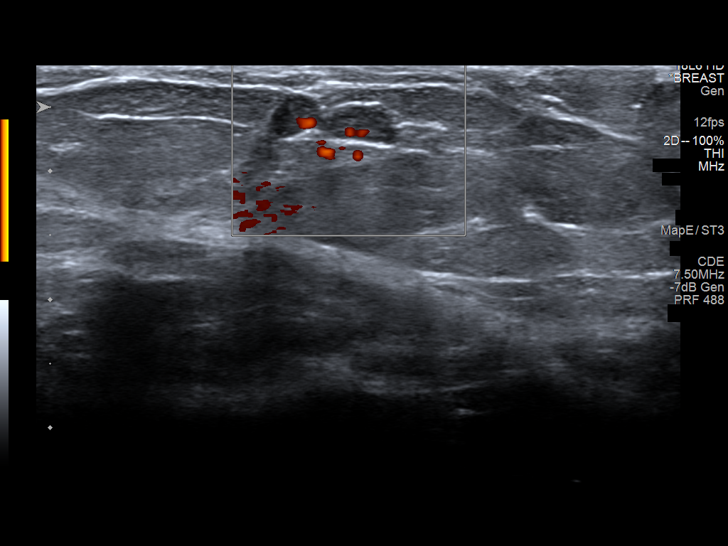
[im 6/10]
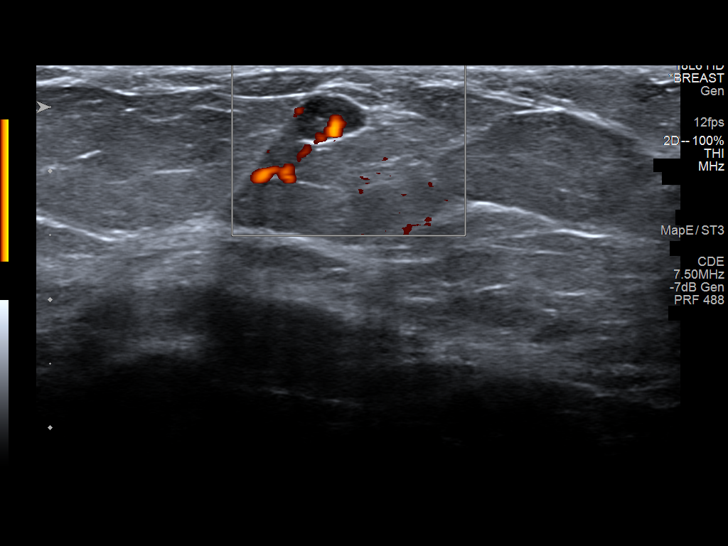
[im 7/10]
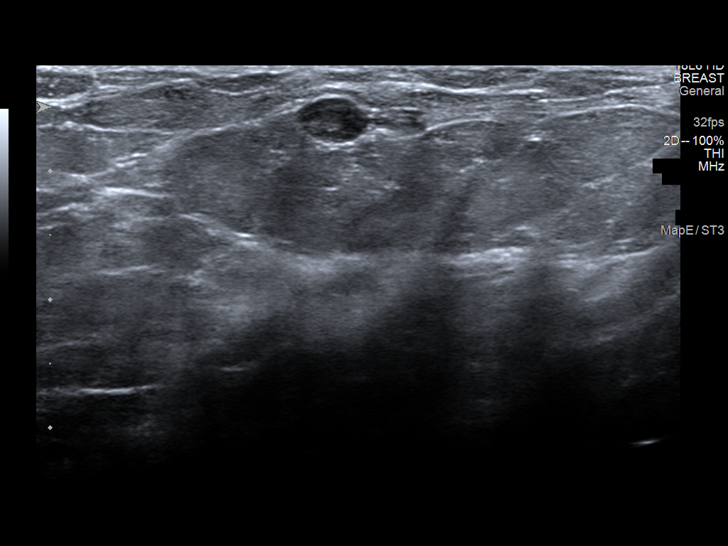
[im 8/10]
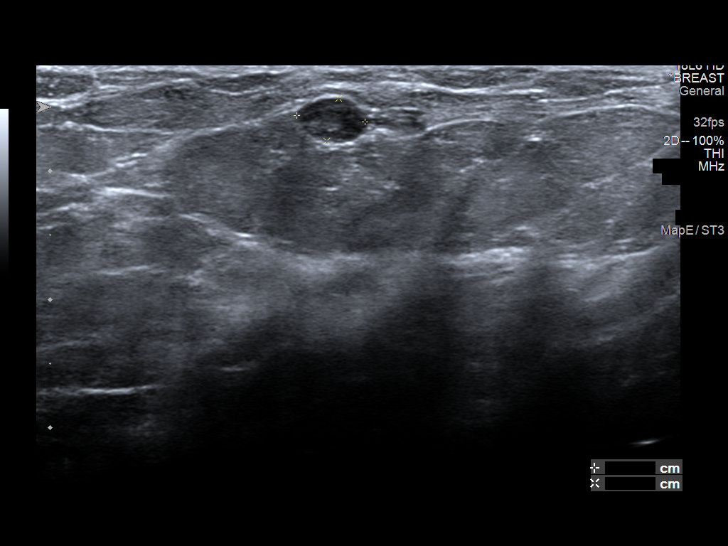
[im 9/10]
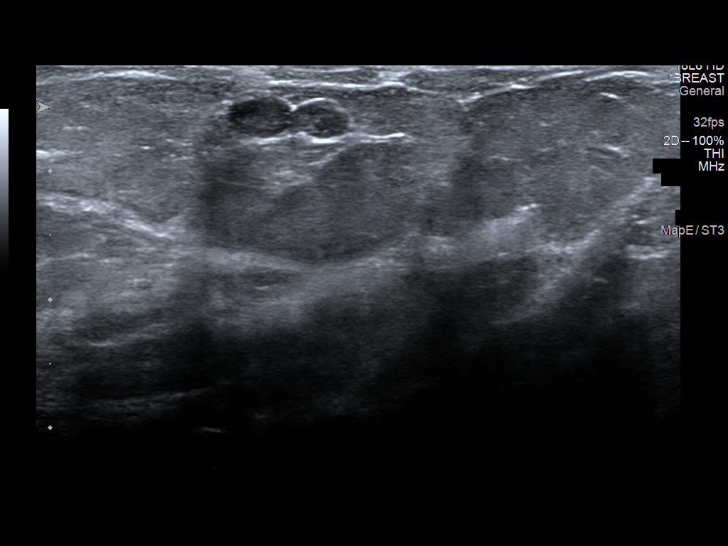
[im 10/10]
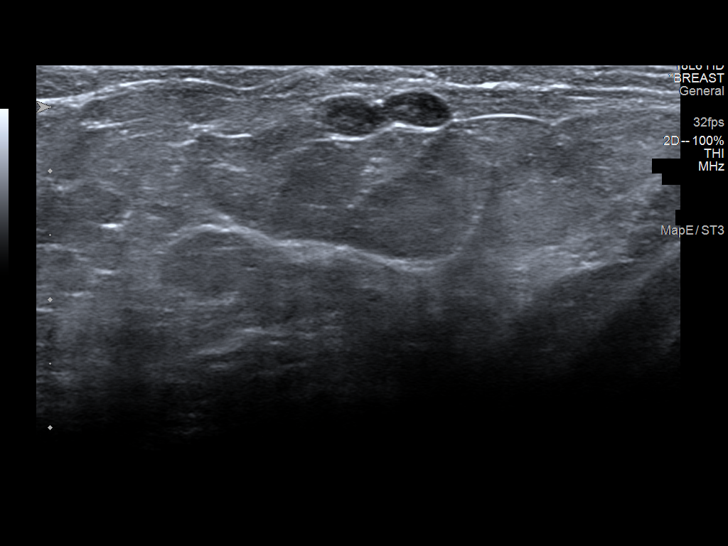

[10 of 10 positions shown; findings below may reference images not displayed]

ACR Breast Density Category b: There are scattered areas of
fibroglandular density.
FINDINGS: 3D tomographic and 2D generated spot compression views of the left
breast confirm an oval, trilobed, circumscribed mass or 3 adjacent
masses in the upper-outer quadrant of the left breast.

On physical exam, no mass is palpable in the outer left breast.

Targeted ultrasound is performed, showing a normal appearing,
trilobed intramammary lymph node in the 3 o'clock position of the
left breast, 5 cm from the nipple. This corresponds to the
mammographic mass.
IMPRESSION: No evidence of malignancy. The mammographic finding in the lateral
left breast is a normal intramammary lymph node.

RECOMMENDATION:
Bilateral screening mammogram in 1 year.

I have discussed the findings and recommendations with the patient.
If applicable, a reminder letter will be sent to the patient
regarding the next appointment.

BI-RADS CATEGORY  2: Benign.

## 2020-08-08 IMAGING — MG MM DIGITAL DIAGNOSTIC UNILAT*L* W/ TOMO W/ CAD
4 series · 4 of 12 positions shown · non-contrast
Comparison: Baseline screening mammogram dated 10/13/2019.

CLINICAL DATA: Three adjacent masses in the upper-outer left breast
on a recent baseline screening mammogram [HOSPITAL] OBGYN.

EXAM:
DIGITAL DIAGNOSTIC LEFT MAMMOGRAM WITH TOMO
ULTRASOUND LEFT BREAST

[L MLO synth-2D]
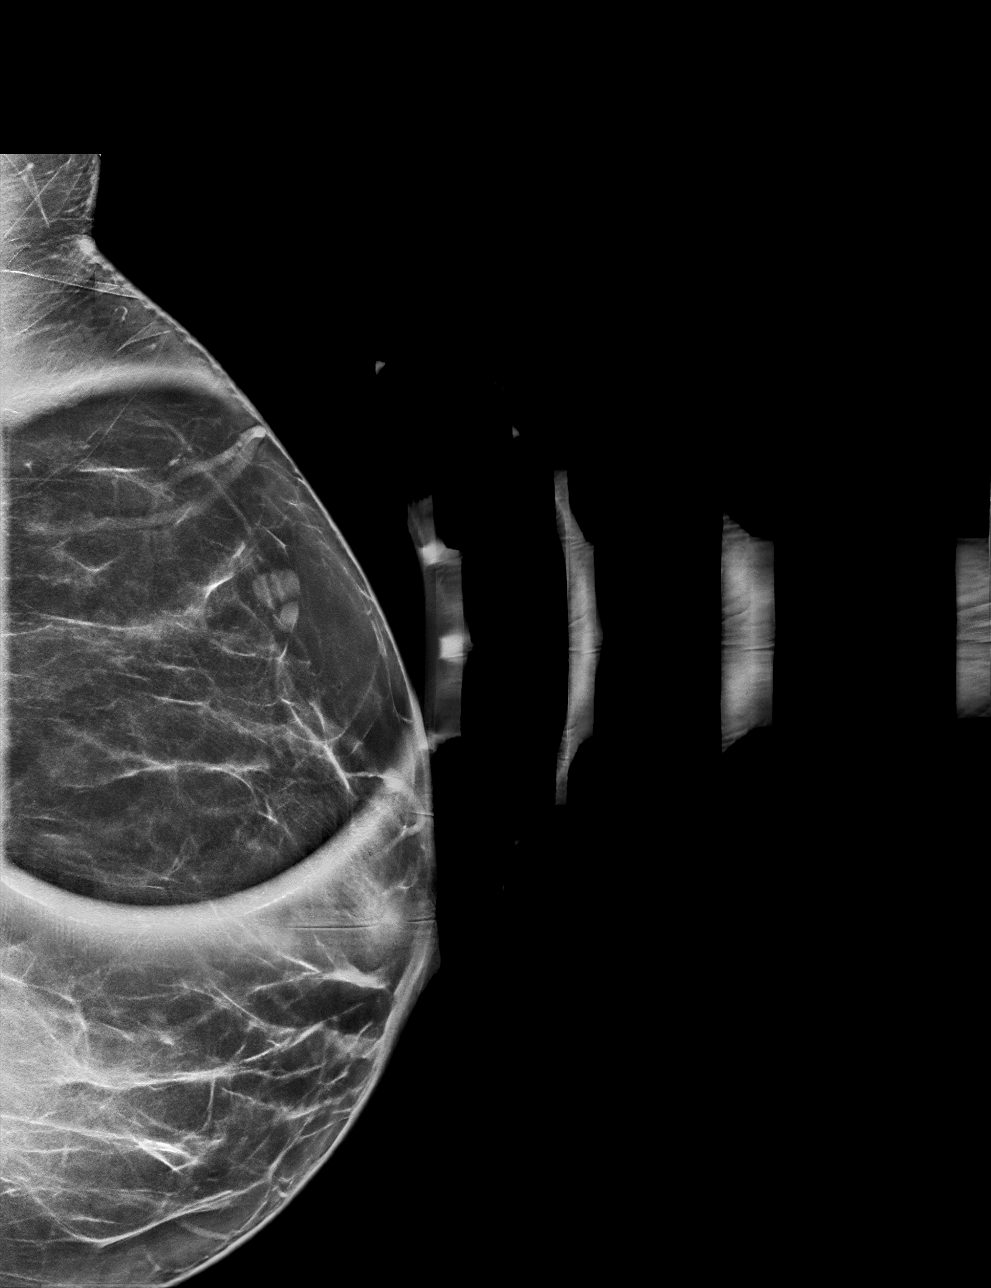

[L CC synth-2D]
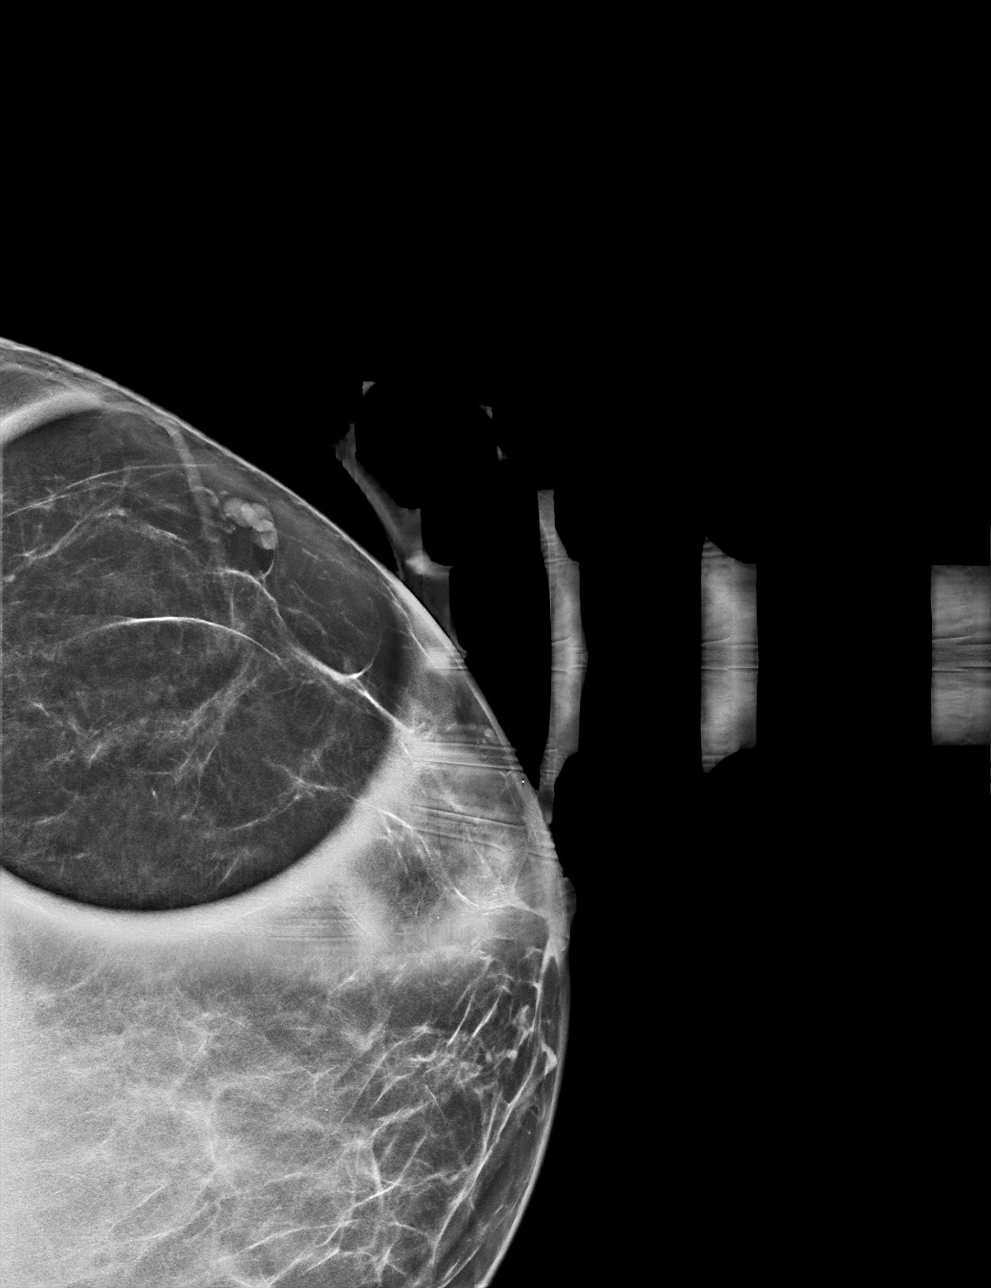

[L MLO tomo · tomo slice 36/71.0]
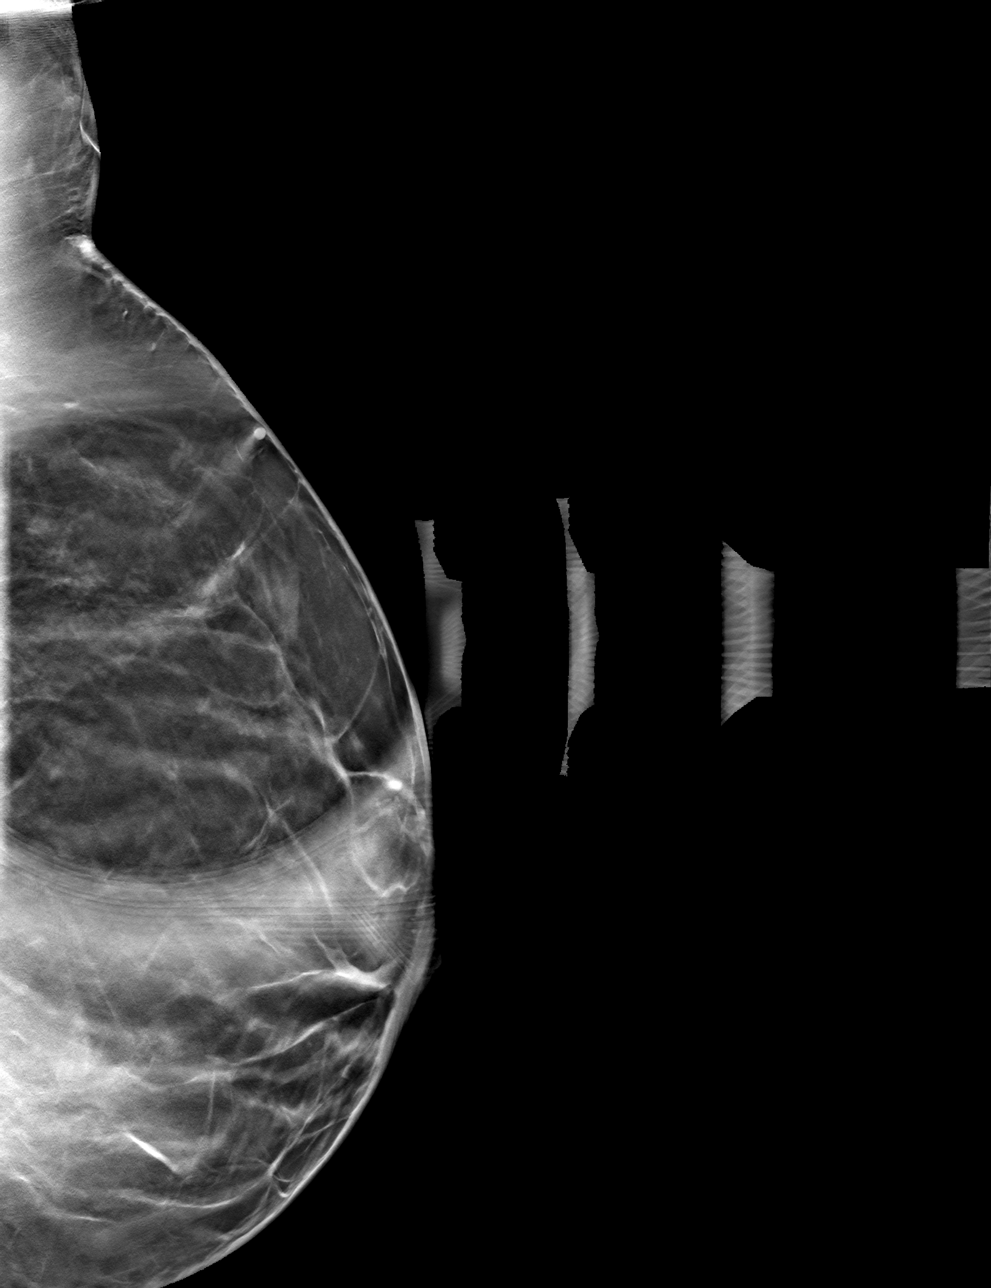

[L CC tomo · tomo slice 31/61.0]
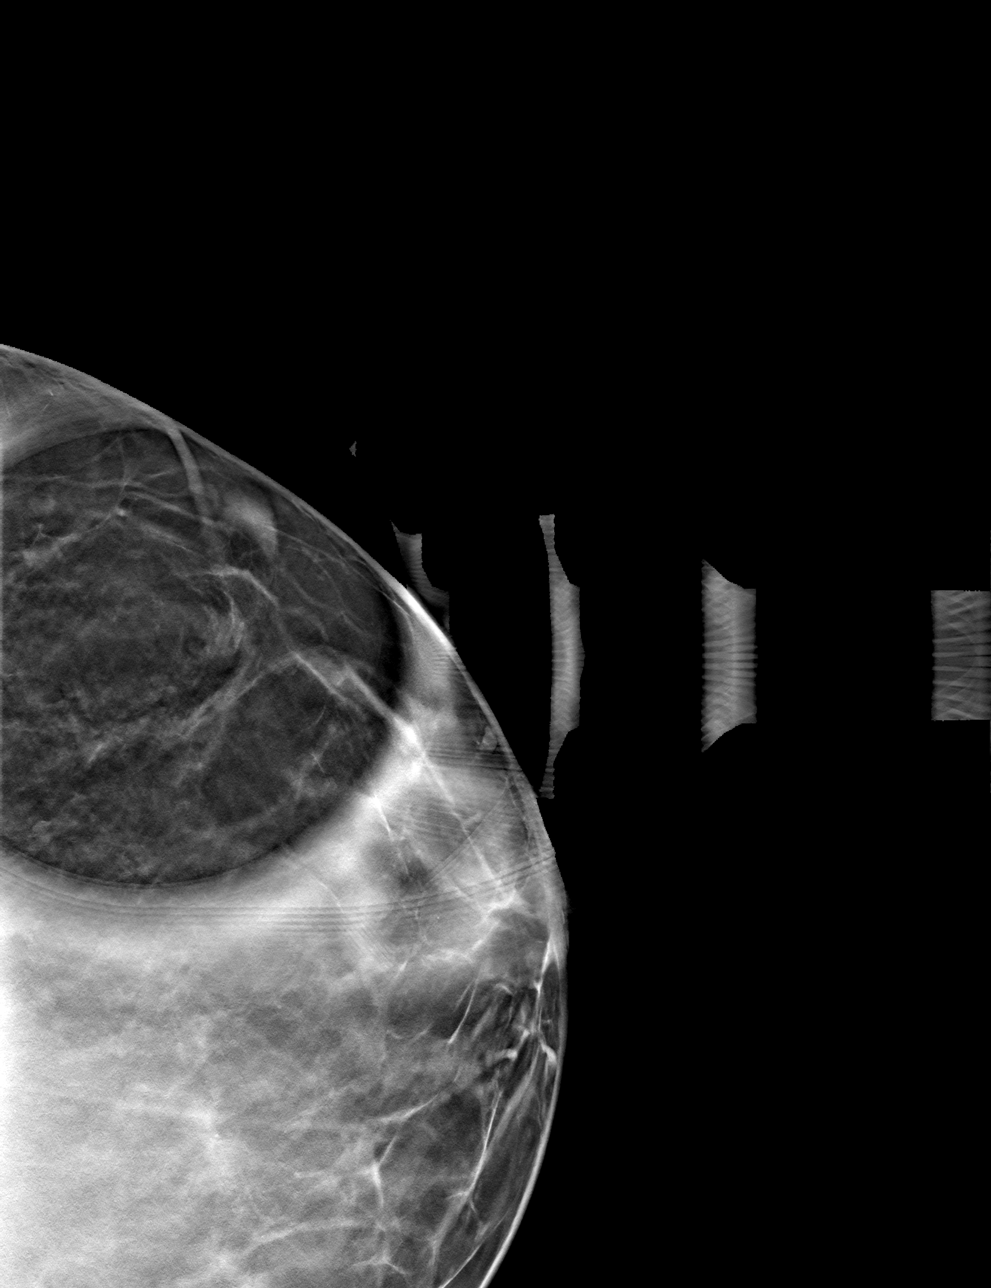

[4 of 12 positions shown; findings below may reference images not displayed]

ACR Breast Density Category b: There are scattered areas of
fibroglandular density.
FINDINGS: 3D tomographic and 2D generated spot compression views of the left
breast confirm an oval, trilobed, circumscribed mass or 3 adjacent
masses in the upper-outer quadrant of the left breast.

On physical exam, no mass is palpable in the outer left breast.

Targeted ultrasound is performed, showing a normal appearing,
trilobed intramammary lymph node in the 3 o'clock position of the
left breast, 5 cm from the nipple. This corresponds to the
mammographic mass.
IMPRESSION: No evidence of malignancy. The mammographic finding in the lateral
left breast is a normal intramammary lymph node.

RECOMMENDATION:
Bilateral screening mammogram in 1 year.

I have discussed the findings and recommendations with the patient.
If applicable, a reminder letter will be sent to the patient
regarding the next appointment.

BI-RADS CATEGORY  2: Benign.

## 2022-05-07 ENCOUNTER — Encounter: Payer: Self-pay | Admitting: Nurse Practitioner

## 2022-06-04 ENCOUNTER — Encounter: Payer: Self-pay | Admitting: Nurse Practitioner

## 2022-06-04 ENCOUNTER — Ambulatory Visit (INDEPENDENT_AMBULATORY_CARE_PROVIDER_SITE_OTHER): Payer: BC Managed Care – PPO | Admitting: Nurse Practitioner

## 2022-06-04 VITALS — BP 136/82 | HR 64 | Ht 62.0 in | Wt 236.0 lb

## 2022-06-04 DIAGNOSIS — Z1211 Encounter for screening for malignant neoplasm of colon: Secondary | ICD-10-CM | POA: Diagnosis not present

## 2022-06-04 DIAGNOSIS — Z800 Family history of malignant neoplasm of digestive organs: Secondary | ICD-10-CM

## 2022-06-04 MED ORDER — NA SULFATE-K SULFATE-MG SULF 17.5-3.13-1.6 GM/177ML PO SOLN: 1.0000 | Freq: Once | ORAL | 0 refills | Status: AC

## 2022-06-04 NOTE — Progress Notes (Signed)
Chief Complaint:  Mallory Russell of colon cancer   Assessment & Plan   # 41 yo female with [facility] of stage II colon cancer in Father at age 51. Patient has no alarm symptoms. She is not anemic.  Schedule for a colonoscopy. The risks and benefits of colonoscopy with possible polypectomy / biopsies were discussed and the patient agrees to proceed.  Patient requesting Dr. Fuller Plan for colonoscopy. Her mother is one of his patients.    HPI:    41 yo female with a PMH of hypothyroidism, polycystic ovary disease. obesity .She is referred by GYN for colon cancer screening. She has a [facility] of colon cancer. Her father was diagnosed with stage II colon cancer in his early 53's.   Patient has not had any bowel changes or blood in her stool. She has no Gi complaints.   Labs:  Feb 2023 Hgb 13.3.   Past Medical History:  Diagnosis Date   Anemia    Cancer ([place])    skin - heel of right foot   Eczema    Hypothyroidism    Thyroid disease    Past Surgical History:  Procedure Laterality Date   BILATERAL SALPINGECTOMY Bilateral 03/09/2013   Procedure: BILATERAL SALPINGECTOMY;  Surgeon: Lovenia Kim, MD;  Location: [facility];  Service: Obstetrics;  Laterality: Bilateral;   CESAREAN SECTION  2012, 2009   CESAREAN SECTION N/A 03/09/2013   Procedure: CESAREAN SECTION;  Surgeon: Lovenia Kim, MD;  Location: [facility];  Service: Obstetrics;  Laterality: N/A;   DILITATION & CURRETTAGE/HYSTROSCOPY WITH NOVASURE ABLATION N/A 08/23/2018   Procedure: DILATATION & CURETTAGE/HYSTEROSCOPY WITH NOVASURE ABLATION;  Surgeon: Brien Few, MD;  Location: [facility];  Service: Gynecology;  Laterality: N/A;   TONSILLECTOMY     TUBAL LIGATION     WISDOM TOOTH EXTRACTION     Family History  Problem Relation Age of Onset   Healthy Mother    Cancer Father        lung   Breast cancer Paternal Aunt    Breast cancer Paternal Aunt    Social History   Tobacco Use   Smoking status: Former    Packs/day: 0.50    Years: 6.00     Total pack years: 3.00    Types: Cigarettes    Quit date: 03/07/2006    Years since quitting: 16.2   Smokeless tobacco: Never  Vaping Use   Vaping Use: Never used  Substance Use Topics   Alcohol use: Yes    Alcohol/week: 1.0 standard drink of alcohol    Types: 1 Glasses of wine per week   Drug use: No   Current Outpatient Medications  Medication Sig Dispense Refill   cetirizine (ZYRTEC) 10 MG tablet Take 10 mg by mouth daily as needed for allergies.     chlorpheniramine-HYDROcodone (TUSSIONEX PENNKINETIC ER) 10-8 MG/5ML SUER Take 5 mLs by mouth at bedtime as needed. 60 mL 0   EPINEPHrine (EPIPEN 2-PAK) 0.3 mg/0.3 mL IJ SOAJ injection Inject 0.3 mg into the muscle once.     fluticasone (FLONASE) 50 MCG/ACT nasal spray Place 1 spray into both nostrils daily as needed for allergies or rhinitis.     ibuprofen (ADVIL,MOTRIN) 200 MG tablet Take 600 mg by mouth 3 (three) times daily as needed for moderate pain.     levothyroxine (SYNTHROID, LEVOTHROID) 175 MCG tablet Take 175 mcg by mouth daily.     PRESCRIPTION MEDICATION Inject 1 Dose as directed once a week. Allergy shots  No current facility-administered medications for this visit.   Allergies  Allergen Reactions   Adhesive [Tape] Rash    If left on for extended periods of time      Review of Systems: All systems reviewed and negative except where noted in HPI.   Wt Readings from Last 3 Encounters:  11/15/18 240 lb (108.9 kg)  08/23/18 236 lb (107 kg)  04/16/17 210 lb (95.3 kg)    Physical Exam   BP 136/82   Pulse 64   Ht '5\' 2"'$  (1.575 m)   Wt 236 lb (107 kg)   BMI 43.16 kg/m  Constitutional:  Generally well appearing female in no acute distress. Psychiatric: Pleasant. Normal mood and affect. Behavior is normal. EENT: Pupils normal.  Conjunctivae are normal. No scleral icterus. Neck supple.  Cardiovascular: Normal rate, regular rhythm. No edema Pulmonary/chest: Effort normal and breath sounds normal. No wheezing,  rales or rhonchi. Abdominal: Soft, nondistended, nontender. Bowel sounds active throughout. There are no masses palpable. No hepatomegaly. Neurological: Alert and oriented to person place and time. Skin: Skin is warm and dry. No rashes noted.  Tye Savoy, NP  06/04/2022, 8:30 AM  Cc:  Referring Provider Brien Few, MD

## 2022-06-04 NOTE — Patient Instructions (Signed)
We have sent the following medications to your pharmacy for you to pick up at your convenience: [place] have been scheduled for a colonoscopy. Please follow written instructions given to you at your visit today.  Please pick up your prep supplies at the pharmacy within the next 1-3 days. If you use inhalers (even only as needed), please bring them with you on the day of your procedure.   Due to recent changes in healthcare laws, you may see the results of your imaging and laboratory studies on MyChart before your provider has had a chance to review them.  We understand that in some cases there may be results that are confusing or concerning to you. Not all laboratory results come back in the same time frame and the provider may be waiting for multiple results in order to interpret others.  Please give [place] 48 hours in order for your provider to thoroughly review all the results before contacting the office for clarification of your results.    If you are age 15 or older, your body mass index should be between 23-30. Your Body mass index is 43.16 kg/m. If this is out of the aforementioned range listed, please consider follow up with your Primary Care Provider.  If you are age 45 or younger, your body mass index should be between 19-25. Your Body mass index is 43.16 kg/m. If this is out of the aformentioned range listed, please consider follow up with your Primary Care Provider.   ________________________________________________________  The [place] [facility] providers would like to encourage you to use [facility] to communicate with providers for non-urgent requests or questions.  Due to long hold times on the telephone, sending your provider a message by [facility] may be a faster and more efficient way to get a response.  Please allow 48 business hours for a response.  Please remember that this is for non-urgent requests.  _______________________________________________________   I appreciate the   opportunity to care for you  Thank You   [place]

## 2022-07-22 ENCOUNTER — Encounter: Payer: BC Managed Care – PPO | Admitting: Gastroenterology

## 2022-09-02 ENCOUNTER — Telehealth: Payer: Self-pay | Admitting: Gastroenterology

## 2022-09-02 NOTE — Telephone Encounter (Signed)
Spoke with pt and advised her as long as she doesn't develop a fever we can continue as scheduled, since all covid, flu and RSV tests have come back negative.  I told her ok to take cold medicine.  She states, "I feel so much better from yesterday to today but just wanted to make sure ok to proceed."  She will call if she develops a fever or new symptoms

## 2022-09-02 NOTE — Telephone Encounter (Signed)
Inbound call from patient stating that she has a colonoscopy scheduled for tomorrow at 2:30 with Dr. Fuller Plan. Patient stated that she has a cold and has tested for covid, flu and RSV yesterday and it was negative. Patient is also wanting to know if she can take cold medication. Patient is requesting a call back to discuss if she can have procedure. Please advise.

## 2022-09-03 ENCOUNTER — Ambulatory Visit (AMBULATORY_SURGERY_CENTER): Payer: BC Managed Care – PPO | Admitting: Gastroenterology

## 2022-09-03 ENCOUNTER — Encounter: Payer: Self-pay | Admitting: Gastroenterology

## 2022-09-03 VITALS — BP 139/93 | HR 63 | Temp 98.7°F | Resp 16 | Ht 62.0 in | Wt 236.0 lb

## 2022-09-03 DIAGNOSIS — D120 Benign neoplasm of cecum: Secondary | ICD-10-CM

## 2022-09-03 DIAGNOSIS — Z800 Family history of malignant neoplasm of digestive organs: Secondary | ICD-10-CM | POA: Diagnosis not present

## 2022-09-03 DIAGNOSIS — Z1211 Encounter for screening for malignant neoplasm of colon: Secondary | ICD-10-CM | POA: Diagnosis present

## 2022-09-03 DIAGNOSIS — K635 Polyp of colon: Secondary | ICD-10-CM | POA: Diagnosis not present

## 2022-09-03 MED ORDER — SODIUM CHLORIDE 0.9 % IV SOLN: 500.0000 mL | Freq: Once | INTRAVENOUS | Status: DC

## 2022-09-03 NOTE — Op Note (Signed)
[facility] Patient Name: Mallory Russell Procedure Date: 09/03/2022 2:02 PM MRN: [id number] Endoscopist: Ladene Artist , MD, [id number] Age: 41 Referring MD:  Date of Birth: 02-24-1981 Gender: Female Account #: [id number] Procedure:                Colonoscopy Indications:              Screening in patient at increased risk: Family                            history of 1st-degree relative with colorectal                            cancer before age 30 years Medicines:                Monitored Anesthesia Care Procedure:                Pre-Anesthesia Assessment:                           - Prior to the procedure, a History and Physical                            was performed, and patient medications and                            allergies were reviewed. The patient's tolerance of                            previous anesthesia was also reviewed. The risks                            and benefits of the procedure and the sedation                            options and risks were discussed with the patient.                            All questions were answered, and informed consent                            was obtained. Prior Anticoagulants: The patient has                            taken no anticoagulant or antiplatelet agents. ASA                            Grade Assessment: III - A patient with severe                            systemic disease. After reviewing the risks and                            benefits, the patient was deemed in satisfactory  condition to undergo the procedure.                           After obtaining informed consent, the colonoscope                            was passed under direct vision. Throughout the                            procedure, the patient's blood pressure, pulse, and                            oxygen saturations were monitored continuously. The                            Olympus CF-HQ190L ([id number])  Colonoscope was                            introduced through the anus and advanced to the the                            cecum, identified by appendiceal orifice and                            ileocecal valve. The ileocecal valve, appendiceal                            orifice, and rectum were photographed. The quality                            of the bowel preparation was excellent. The                            colonoscopy was performed without difficulty. The                            patient tolerated the procedure well. Scope In: 2:08:47 PM Scope Out: 2:25:12 PM Scope Withdrawal Time: 0 hours 11 minutes 54 seconds  Total Procedure Duration: 0 hours 16 minutes 25 seconds  Findings:                 The perianal and digital rectal examinations were                            normal.                           A 6 mm polyp was found in the cecum. The polyp was                            sessile. The polyp was removed with a cold snare.                            Resection and retrieval were complete.  The exam was otherwise without abnormality on                            direct and retroflexion views. Complications:            No immediate complications. Estimated blood loss:                            None. Estimated Blood Loss:     Estimated blood loss: none. Impression:               - One 6 mm polyp in the cecum, removed with a cold                            snare. Resected and retrieved.                           - The examination was otherwise normal on direct                            and retroflexion views. Recommendation:           - Repeat colonoscopy date to be determined, likely                            5 years, after pending pathology results are                            reviewed for surveillance based on pathology                            results.                           - Patient has a contact number available for                             emergencies. The signs and symptoms of potential                            delayed complications were discussed with the                            patient. Return to normal activities tomorrow.                            Written discharge instructions were provided to the                            patient.                           - Resume previous diet.                           - Continue present medications.                           -  Await pathology results. Ladene Artist, MD 09/03/2022 2:28:12 PM This report has been signed electronically.

## 2022-09-03 NOTE — Progress Notes (Signed)
Called to room to assist during endoscopic procedure.  Patient ID and intended procedure confirmed with present staff. Received instructions for my participation in the procedure from the performing physician.  

## 2022-09-03 NOTE — Progress Notes (Signed)
Sedate, gd SR, tolerated procedure well, VSS, report to RN 

## 2022-09-03 NOTE — Progress Notes (Signed)
Pt's states no medical or surgical changes since previsit or office visit. 

## 2022-09-03 NOTE — Progress Notes (Signed)
History & Physical  Primary Care Physician:  [facility] Primary Gastroenterologist: Lucio Edward, MD  CHIEF COMPLAINT:   [facility]  HPI: Mallory Russell is a 41 y.o. female with a family history of colon cancer, first-degree relative, for colonoscopy.   Past Medical History:  Diagnosis Date   Allergy    Anemia    Asthma    Cancer ([place])    skin - heel of right foot   Eczema    Hypothyroidism    Obesity    Thyroid disease     Past Surgical History:  Procedure Laterality Date   BILATERAL SALPINGECTOMY Bilateral 03/09/2013   Procedure: BILATERAL SALPINGECTOMY;  Surgeon: Lovenia Kim, MD;  Location: [facility];  Service: Obstetrics;  Laterality: Bilateral;   CESAREAN SECTION  2012, 2009   CESAREAN SECTION N/A 03/09/2013   Procedure: CESAREAN SECTION;  Surgeon: Lovenia Kim, MD;  Location: [facility];  Service: Obstetrics;  Laterality: N/A;   DILITATION & CURRETTAGE/HYSTROSCOPY WITH NOVASURE ABLATION N/A 08/23/2018   Procedure: DILATATION & CURETTAGE/HYSTEROSCOPY WITH NOVASURE ABLATION;  Surgeon: Brien Few, MD;  Location: [facility];  Service: Gynecology;  Laterality: N/A;   TONSILLECTOMY     TUBAL LIGATION     WISDOM TOOTH EXTRACTION      Prior to Admission medications   Medication Sig Start Date End Date Taking? Authorizing Provider  fluticasone (FLONASE) 50 MCG/ACT nasal spray Place 1 spray into both nostrils daily as needed for allergies or rhinitis.   Yes [provider]  levothyroxine (SYNTHROID, LEVOTHROID) 175 MCG tablet Take 225 mcg by mouth daily.   Yes [provider]  cetirizine (ZYRTEC) 10 MG tablet Take 10 mg by mouth daily as needed for allergies.    [provider]  chlorpheniramine-HYDROcodone (TUSSIONEX PENNKINETIC ER) 10-8 MG/5ML SUER Take 5 mLs by mouth at bedtime as needed. 11/15/18   Coral Spikes, DO  EPINEPHrine (EPIPEN 2-PAK) 0.3 mg/0.3 mL IJ SOAJ injection Inject 0.3 mg into the muscle once.    [provider]  ibuprofen (ADVIL,MOTRIN) 200 MG tablet Take 600 mg by mouth 3 (three) times daily as needed for moderate pain.    [provider]  PRESCRIPTION MEDICATION Inject 1 Dose as directed once a week. Allergy shots    [provider]    Current Outpatient Medications  Medication Sig Dispense Refill   fluticasone (FLONASE) 50 MCG/ACT nasal spray Place 1 spray into both nostrils daily as needed for allergies or rhinitis.     levothyroxine (SYNTHROID, LEVOTHROID) 175 MCG tablet Take 225 mcg by mouth daily.     cetirizine (ZYRTEC) 10 MG tablet Take 10 mg by mouth daily as needed for allergies.     chlorpheniramine-HYDROcodone (TUSSIONEX PENNKINETIC ER) 10-8 MG/5ML SUER Take 5 mLs by mouth at bedtime as needed. 60 mL 0   EPINEPHrine (EPIPEN 2-PAK) 0.3 mg/0.3 mL IJ SOAJ injection Inject 0.3 mg into the muscle once.     ibuprofen (ADVIL,MOTRIN) 200 MG tablet Take 600 mg by mouth 3 (three) times daily as needed for moderate pain.     PRESCRIPTION MEDICATION Inject 1 Dose as directed once a week. Allergy shots     Current Facility-Administered Medications  Medication Dose Route Frequency Provider Last Rate Last Admin   0.9 %  sodium chloride infusion  500 mL Intravenous Once Ladene Artist, MD        Allergies as of 09/03/2022 - Review Complete 09/03/2022  Allergen Reaction Noted   Adhesive [tape]  Rash 43/56/8616   Silicone Rash 43/72/9021    Family History  Problem Relation Age of Onset   Healthy Mother    Diabetes Mother    Cancer Father        lung   Colon cancer Father    Colon polyps Father    Breast cancer Paternal Aunt    Heart disease Maternal Grandmother    Heart disease Paternal Grandfather    Rectal cancer Neg Hx    Esophageal cancer Neg Hx    Stomach cancer Neg Hx     Social History   Socioeconomic History   Marital status: Married    Spouse name: Not on file   Number of children: 3   Years of education: Not on file   Highest education level: Not  on file  Occupational History   Occupation: Pharmacist, hospital  Tobacco Use   Smoking status: Former    Packs/day: 0.50    Years: 6.00    Total pack years: 3.00    Types: Cigarettes    Quit date: 03/07/2006    Years since quitting: 16.5   Smokeless tobacco: Never  Vaping Use   Vaping Use: Never used  Substance and Sexual Activity   Alcohol use: Not Currently    Alcohol/week: 1.0 standard drink of alcohol    Types: 1 Glasses of wine per week   Drug use: No   Sexual activity: Yes    Birth control/protection: Surgical  Other Topics Concern   Not on file  Social History Narrative   Not on file   Social Determinants of Health   Financial Resource Strain: Not on file  Food Insecurity: Not on file  Transportation Needs: Not on file  Physical Activity: Not on file  Stress: Not on file  Social Connections: Not on file  Intimate Partner Violence: Not on file    Review of Systems:  All systems reviewed were negative except where noted in HPI.   Physical Exam: General:  Alert, well-developed, in NAD Head:  Normocephalic and atraumatic. Eyes:  Sclera clear, no icterus.   Conjunctiva pink. Ears:  Normal auditory acuity. Mouth:  No deformity or lesions.  Neck:  Supple; no masses . Lungs:  Clear throughout to auscultation.   No wheezes, crackles, or rhonchi. No acute distress. Heart:  Regular rate and rhythm; no murmurs. Abdomen:  Soft, nondistended, nontender. No masses, hepatomegaly. No obvious masses.  Normal bowel .    Rectal:  Deferred   Msk:  Symmetrical without gross deformities.. Pulses:  Normal pulses noted. Extremities:  Without edema. Neurologic:  Alert and  oriented x4;  grossly normal neurologically. Skin:  Intact without significant lesions or rashes. Cervical Nodes:  No significant cervical adenopathy. Psych:  Alert and cooperative. Normal mood and affect.  Impression / Russell:   Family history of colon cancer, first-degree relative, for colonoscopy.  Mallory Russell.  Mallory Russell  09/03/2022, 2:02 PM See Shea Evans, [facility], to contact our on call provider

## 2022-09-03 NOTE — Patient Instructions (Signed)
Handout on polyps given to patient. Await pathology results. Resume previous diet and continue present medications.  Repeat colonoscopy for surveillance will be determined based off of pathology results.   YOU HAD AN ENDOSCOPIC PROCEDURE TODAY AT THE [facility]:   Refer to the procedure report that was given to you for any specific questions about what was found during the examination.  If the procedure report does not answer your questions, please call your gastroenterologist to clarify.  If you requested that your care partner not be given the details of your procedure findings, then the procedure report has been included in a sealed envelope for you to review at your convenience later.  YOU SHOULD EXPECT: Some feelings of bloating in the abdomen. Passage of more gas than usual.  Walking can help get rid of the air that was put into your GI tract during the procedure and reduce the bloating. If you had a lower endoscopy (such as a colonoscopy or flexible sigmoidoscopy) you may notice spotting of blood in your stool or on the toilet paper. If you underwent a bowel prep for your procedure, you may not have a normal bowel movement for a few days.  Please Note:  You might notice some irritation and congestion in your nose or some drainage.  This is from the oxygen used during your procedure.  There is no need for concern and it should clear up in a day or so.  SYMPTOMS TO REPORT IMMEDIATELY:  Following lower endoscopy (colonoscopy or flexible sigmoidoscopy):  Excessive amounts of blood in the stool  Significant tenderness or worsening of abdominal pains  Swelling of the abdomen that is new, acute  Fever of 100F or higher  For urgent or emergent issues, a gastroenterologist can be reached at any hour by calling [phone]. Do not use MyChart messaging for urgent concerns.    DIET:  We do recommend a small meal at first, but then you may proceed to your regular diet.  Drink  plenty of fluids but you should avoid alcoholic beverages for 24 hours.  ACTIVITY:  You should plan to take it easy for the rest of today and you should NOT DRIVE or use heavy machinery until tomorrow (because of the sedation medicines used during the test).    FOLLOW UP: Our staff will call the number listed on your records the next business day following your procedure.  We will call around 7:15- 8:00 am to check on you and address any questions or concerns that you may have regarding the information given to you following your procedure. If we do not reach you, we will leave a message.     If any biopsies were taken you will be contacted by phone or by letter within the next 1-3 weeks.  Please call us at [phone] if you have not heard about the biopsies in 3 weeks.    SIGNATURES/CONFIDENTIALITY: You and/or your care partner have signed paperwork which will be entered into your electronic medical record.  These signatures attest to the fact that that the information above on your After Visit Summary has been reviewed and is understood.  Full responsibility of the confidentiality of this discharge information lies with you and/or your care-partner. 

## 2022-09-04 ENCOUNTER — Telehealth: Payer: Self-pay

## 2022-09-04 NOTE — Telephone Encounter (Signed)
  Follow up Call-     09/03/2022    1:35 PM  Call back number  Post procedure Call Back phone  # [phone]  Permission to leave phone message Yes     Patient questions:  Do you have a fever, pain , or abdominal swelling? No. Pain Score  0 *  Have you tolerated food without any problems? Yes.    Have you been able to return to your normal activities? Yes.    Do you have any questions about your discharge instructions: Diet   No. Medications  No. Follow up visit  No.  Do you have questions or concerns about your Care? No.  Actions: * If pain score is 4 or above: No action needed, pain <4.

## 2022-09-18 ENCOUNTER — Encounter: Payer: Self-pay | Admitting: Gastroenterology

## 2024-09-14 ENCOUNTER — Other Ambulatory Visit: Payer: Self-pay | Admitting: Medical Genetics

## 2024-09-20 ENCOUNTER — Other Ambulatory Visit
Admission: RE | Admit: 2024-09-20 | Discharge: 2024-09-20 | Disposition: A | Discharge status: Home / Self Care | Payer: Self-pay | Source: Ambulatory Visit | Attending: Medical Genetics | Admitting: Medical Genetics

## 2024-10-03 LAB — GENECONNECT MOLECULAR SCREEN: Genetic Analysis Overall Interpretation: NEGATIVE
